# Patient Record
Sex: Female | Born: 1985
Health system: Southern US, Community
[De-identification: ages and names within clinical notes are randomized; demographics above are authoritative.]

## PROBLEM LIST (undated history)

## (undated) DIAGNOSIS — O2686 Pruritic urticarial papules and plaques of pregnancy (PUPPP): Secondary | ICD-10-CM

## (undated) DIAGNOSIS — O09299 Supervision of pregnancy with other poor reproductive or obstetric history, unspecified trimester: Secondary | ICD-10-CM

## (undated) DIAGNOSIS — E282 Polycystic ovarian syndrome: Secondary | ICD-10-CM

## (undated) DIAGNOSIS — K429 Umbilical hernia without obstruction or gangrene: Secondary | ICD-10-CM

## (undated) DIAGNOSIS — Z319 Encounter for procreative management, unspecified: Secondary | ICD-10-CM

## (undated) DIAGNOSIS — R7303 Prediabetes: Secondary | ICD-10-CM

## (undated) HISTORY — DX: Prediabetes: R73.03

## (undated) HISTORY — DX: Encounter for procreative management, unspecified: Z31.9

## (undated) HISTORY — DX: Polycystic ovarian syndrome: E28.2

## (undated) HISTORY — DX: Pruritic urticarial papules and plaques of pregnancy (puppp): O26.86

## (undated) HISTORY — PX: TONSILLECTOMY AND ADENOIDECTOMY: SUR1326

## (undated) HISTORY — DX: Supervision of pregnancy with other poor reproductive or obstetric history, unspecified trimester: O09.299

---
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
## 2007-04-01 HISTORY — PX: SHOULDER SURGERY: SHX246

## 2010-03-31 HISTORY — PX: COLPOSCOPY: SHX161

## 2011-04-01 HISTORY — PX: KNEE SURGERY: SHX244

## 2014-05-09 DIAGNOSIS — E282 Polycystic ovarian syndrome: Secondary | ICD-10-CM | POA: Insufficient documentation

## 2014-05-19 DIAGNOSIS — N926 Irregular menstruation, unspecified: Secondary | ICD-10-CM | POA: Insufficient documentation

## 2014-07-13 ENCOUNTER — Encounter: Payer: Self-pay | Admitting: Certified Nurse Midwife

## 2014-07-13 ENCOUNTER — Ambulatory Visit (INDEPENDENT_AMBULATORY_CARE_PROVIDER_SITE_OTHER): Payer: Medicaid Other | Admitting: Certified Nurse Midwife

## 2014-07-13 VITALS — BP 121/81 | HR 68 | Temp 97.8°F | Ht 67.0 in | Wt 201.0 lb

## 2014-07-13 DIAGNOSIS — L209 Atopic dermatitis, unspecified: Secondary | ICD-10-CM

## 2014-07-13 DIAGNOSIS — Z01419 Encounter for gynecological examination (general) (routine) without abnormal findings: Secondary | ICD-10-CM

## 2014-07-13 DIAGNOSIS — Z124 Encounter for screening for malignant neoplasm of cervix: Secondary | ICD-10-CM | POA: Diagnosis not present

## 2014-07-13 DIAGNOSIS — Z0000 Encounter for general adult medical examination without abnormal findings: Secondary | ICD-10-CM | POA: Diagnosis not present

## 2014-07-13 DIAGNOSIS — E282 Polycystic ovarian syndrome: Secondary | ICD-10-CM | POA: Diagnosis not present

## 2014-07-13 MED ORDER — PROGESTERONE MICRONIZED 200 MG PO CAPS: ORAL_CAPSULE | ORAL | Status: DC

## 2014-07-13 MED ORDER — TRIAMCINOLONE ACETONIDE 0.5 % EX OINT: 1.0000 "application " | TOPICAL_OINTMENT | Freq: Two times a day (BID) | CUTANEOUS | Status: DC

## 2014-07-13 MED ORDER — METFORMIN HCL 500 MG PO TABS: 500.0000 mg | ORAL_TABLET | Freq: Two times a day (BID) | ORAL | Status: DC

## 2014-07-13 NOTE — Progress Notes (Signed)
Patient ID: Kathy Williamson, female   DOB: [birth year], 29 y.o.   MRN: [id number]    Subjective:     Kathy Williamson is a 29 y.o. female here for a routine exam.  Current complaints: PCOS.  Prometrium helps with her sleep cycles.  Has been on Prometrium for 200 mg for 10 days for several years.  Does not desire to try OCPs.  Menstrual periods are regular, lasting 5-7 days.  Breast fed her daughter for 13 months.  Not currently sexually active.  Not currently employed.  Had postpartum depression, did not take medications or undergo counseling; mostly situational.   Has had L shoulder & R knee arthroscopy, has tendency for double jointed. Used to be a Counselling psychologist.          Personal health questionnaire:  Is patient Ashkenazi Jewish, have a family history of breast and/or ovarian cancer: no Is there a family history of uterine cancer diagnosed at age < 6, gastrointestinal cancer, urinary tract cancer, family member who is a Personnel officer syndrome-associated carrier: no Is the patient overweight and hypertensive, family history of diabetes, personal history of gestational diabetes, preeclampsia or PCOS: yes Is patient over 46, have PCOS,  family history of premature CHD under age 48, diabetes, smoke, have hypertension or peripheral artery disease:  no At any time, has a partner hit, kicked or otherwise hurt or frightened you?: no Over the past 2 weeks, have you felt down, depressed or hopeless?: no Over the past 2 weeks, have you felt little interest or pleasure in doing things?:sometimes   Gynecologic History Patient's last menstrual period was 07/06/2014. Contraception: abstinence Last Pap: 05/2013. Results were: normal, had hx of abnormal pap smears with colpo in 2012.   Last mammogram: N/A.   Obstetric History OB History  Gravida Para Term Preterm AB SAB TAB Ectopic Multiple Living  # Outcome Date GA Lbr Len/2nd Weight Sex Delivery Anes PTL Lv  1 Term 03/02/12 [redacted]w[redacted]d  3.742 kg (8  lb 4 oz) F Vag-Spont EPI N Y      Past Medical History  Diagnosis Date  . PCOS (polycystic ovarian syndrome)     Past Surgical History  Procedure Laterality Date  . Knee surgery Right 2013  . Shoulder surgery Left 2009  . Tonsillectomy and adenoidectomy    . Colposcopy N/A 2012     Current outpatient prescriptions:  .  metFORMIN (GLUCOPHAGE) 500 MG tablet, Take 1 tablet (500 mg total) by mouth 2 (two) times daily., Disp: 60 tablet, Rfl: 12 .  progesterone (PROMETRIUM) 200 MG capsule, Daily for 10 days out of the month in the evening, Disp: 10 capsule, Rfl: 12 .  triamcinolone ointment (KENALOG) 0.5 %, Apply 1 application topically 2 (two) times daily., Disp: 30 g, Rfl: 2 Allergies  Allergen Reactions  . Azithromycin Hives    History  Substance Use Topics  . Smoking status: Never Smoker   . Smokeless tobacco: Never Used  . Alcohol Use: No    Family History  Problem Relation Age of Onset  . Diabetes Mother   . Diabetes Maternal Grandmother   . Arthritis Maternal Grandmother   . Stroke Maternal Grandmother   . Kidney failure Maternal Grandmother   . Hypertension Maternal Grandmother   . Heart disease Maternal Grandfather   . Kidney failure Maternal Grandfather   . Cardiomyopathy Paternal Grandmother   . Cardiomyopathy Paternal Grandfather       Review  of Systems  Constitutional: negative for fatigue and weight loss, + weight gain about 15 lbs Respiratory: negative for cough and wheezing Cardiovascular: negative for chest pain, fatigue and palpitations Gastrointestinal: negative for abdominal pain and change in bowel habits Musculoskeletal:negative for myalgias Neurological: negative for gait problems and tremors Behavioral/Psych: negative for abusive relationship, depression Endocrine: negative for temperature intolerance   Genitourinary:negative for abnormal menstrual periods, genital lesions, hot flashes, sexual problems and vaginal discharge Integument/breast:  negative for breast lump, breast tenderness, nipple discharge and skin lesion(s)    Objective:       BP 121/81 mmHg  Pulse 68  Temp(Src) 97.8 F (36.6 C)  Ht 5\' 7"  (1.702 m)  Wt 91.173 kg (201 lb)  BMI 31.47 kg/m2  LMP 07/06/2014 General:   alert  Skin:   dermatitis present upper right arm, scars from surgeries on shoulder and knee.    Lungs:   clear to auscultation bilaterally  Heart:   regular rate and rhythm, S1, S2 normal, no murmur, click, rub or gallop  Breasts:   normal without suspicious masses, skin or nipple changes or axillary nodes  Abdomen:  normal findings: no organomegaly, soft, non-tender and no hernia  Pelvis:  External genitalia: normal general appearance Urinary system: urethral meatus normal and bladder without fullness, nontender Vaginal: normal without tenderness, induration or masses Cervix: normal appearance Adnexa: normal bimanual exam Uterus: anteverted and non-tender, normal size   Lab Review Urine pregnancy test Labs reviewed yes Radiologic studies reviewed no  50% of 30 min visit spent on counseling and coordination of care.   Assessment:    Healthy female exam.   PCOS Atopic Dermatitis   Plan:    Education reviewed: depression evaluation, low fat, low cholesterol diet, safe sex/STD prevention, self breast exams, skin cancer screening and weight bearing exercise. Contraception: abstinence. Follow up in: 1 year.   Meds ordered this encounter  Medications  . DISCONTD: metFORMIN (GLUCOPHAGE) 500 MG tablet    Sig: Take 500 mg by mouth.  . DISCONTD: progesterone (PROMETRIUM) 200 MG capsule    Sig: Take 200 mg by mouth.  . metFORMIN (GLUCOPHAGE) 500 MG tablet    Sig: Take 1 tablet (500 mg total) by mouth 2 (two) times daily.    Dispense:  60 tablet    Refill:  12  . progesterone (PROMETRIUM) 200 MG capsule    Sig: Daily for 10 days out of the month in the evening    Dispense:  10 capsule    Refill:  12  . triamcinolone ointment  (KENALOG) 0.5 %    Sig: Apply 1 application topically 2 (two) times daily.    Dispense:  30 g    Refill:  2   Orders Placed This Encounter  Procedures  . SureSwab, Vaginosis/Vaginitis Plus

## 2014-07-17 LAB — SURESWAB, VAGINOSIS/VAGINITIS PLUS
ATOPOBIUM VAGINAE: NOT DETECTED Log (cells/mL)
C. TRACHOMATIS RNA, TMA: NOT DETECTED
C. albicans, DNA: NOT DETECTED
C. glabrata, DNA: NOT DETECTED
C. parapsilosis, DNA: NOT DETECTED
C. tropicalis, DNA: NOT DETECTED
Gardnerella vaginalis: NOT DETECTED Log (cells/mL)
LACTOBACILLUS SPECIES: 7 Log (cells/mL)
MEGASPHAERA SPECIES: NOT DETECTED Log (cells/mL)
N. gonorrhoeae RNA, TMA: NOT DETECTED
T. VAGINALIS RNA, QL TMA: NOT DETECTED

## 2014-07-17 LAB — PAP IG AND HPV HIGH-RISK: HPV DNA High Risk: NOT DETECTED

## 2014-07-18 ENCOUNTER — Ambulatory Visit: Payer: Medicaid Other | Admitting: Certified Nurse Midwife

## 2015-04-23 ENCOUNTER — Other Ambulatory Visit: Payer: Self-pay | Admitting: Orthopedic Surgery

## 2015-04-23 DIAGNOSIS — M1712 Unilateral primary osteoarthritis, left knee: Secondary | ICD-10-CM

## 2015-04-23 DIAGNOSIS — M25562 Pain in left knee: Secondary | ICD-10-CM

## 2015-04-23 DIAGNOSIS — S83412A Sprain of medial collateral ligament of left knee, initial encounter: Secondary | ICD-10-CM

## 2015-05-11 ENCOUNTER — Ambulatory Visit: Payer: BLUE CROSS/BLUE SHIELD

## 2016-05-28 ENCOUNTER — Telehealth: Payer: Self-pay

## 2016-05-28 ENCOUNTER — Encounter: Payer: Self-pay | Admitting: Obstetrics and Gynecology

## 2016-05-28 DIAGNOSIS — Z6791 Unspecified blood type, Rh negative: Secondary | ICD-10-CM | POA: Insufficient documentation

## 2016-05-28 DIAGNOSIS — O26899 Other specified pregnancy related conditions, unspecified trimester: Secondary | ICD-10-CM

## 2016-05-28 NOTE — Telephone Encounter (Signed)
I am going to be off Thurs and Fri. Can you please look for these results and f/u with MD prn. Tonya, pls scan in [facility] and I can check there. Thx.

## 2016-05-28 NOTE — Telephone Encounter (Signed)
Pt had IUI done 05/15/16.  She called a few days ago for appt for blood preg test which she is schedule for tomorrow.  She has started bleeding.  Knows it could be implantation bleeding and it's hard to know if it's that or period. She is wondering if she should still do that test.  Please advise.

## 2016-05-28 NOTE — Telephone Encounter (Signed)
Pt states her blood type is negative as she got the rhogam shot c last preg.  Pt knows to still come in tomorrow for bloodwork.

## 2016-05-28 NOTE — Telephone Encounter (Signed)
(  Kathy Williamson) Pt is just to to lab with us. RN to notify pt to still have labs done. RN to also clarify pt's blood type in case miscarries.

## 2016-05-29 ENCOUNTER — Telehealth: Payer: Self-pay

## 2016-05-30 NOTE — Telephone Encounter (Signed)
Pt's quant from yesterday is <1.  What is next step for pt?  Does she need rhogam?

## 2016-05-30 NOTE — Telephone Encounter (Signed)
Pt's quant from yesterday is <1.  What is next step for this pt.  Does she need to come in for rhogam?

## 2016-05-30 NOTE — Telephone Encounter (Signed)
Let her know hCG is negative.  No need for Rhogam without a pos test. Thx.

## 2016-05-30 NOTE — Telephone Encounter (Signed)
Pt aware.

## 2016-06-10 ENCOUNTER — Other Ambulatory Visit: Payer: Self-pay | Admitting: Obstetrics and Gynecology

## 2016-06-10 MED ORDER — AMBULATORY NON FORMULARY MEDICATION: 2 refills | Status: DC

## 2016-06-11 ENCOUNTER — Telehealth: Payer: Self-pay

## 2016-06-11 NOTE — Telephone Encounter (Signed)
Pt states she has AE scheduled for 06/16/16. She realized she will be out of her topical progesterone for ~5 days prior to apt. Pt requesting enough to get her to apt. States [facility] faxed us a request & has not heard back. Cb#[id number].

## 2016-06-11 NOTE — Telephone Encounter (Signed)
I sent Rx in already. On meds list and was faxed yesterday by [facility] due to non formulary Rx. Pt to check with pharm to make sure they got it. Thx.

## 2016-06-16 ENCOUNTER — Encounter: Payer: Self-pay | Admitting: Obstetrics and Gynecology

## 2016-06-16 ENCOUNTER — Ambulatory Visit (INDEPENDENT_AMBULATORY_CARE_PROVIDER_SITE_OTHER): Payer: BLUE CROSS/BLUE SHIELD | Admitting: Obstetrics and Gynecology

## 2016-06-16 VITALS — BP 122/78 | Ht 67.0 in | Wt 201.0 lb

## 2016-06-16 DIAGNOSIS — N469 Male infertility, unspecified: Secondary | ICD-10-CM | POA: Insufficient documentation

## 2016-06-16 DIAGNOSIS — Z01419 Encounter for gynecological examination (general) (routine) without abnormal findings: Secondary | ICD-10-CM | POA: Diagnosis not present

## 2016-06-16 DIAGNOSIS — R7303 Prediabetes: Secondary | ICD-10-CM

## 2016-06-16 DIAGNOSIS — E282 Polycystic ovarian syndrome: Secondary | ICD-10-CM

## 2016-06-16 MED ORDER — AMBULATORY NON FORMULARY MEDICATION: 11 refills | Status: DC

## 2016-06-16 MED ORDER — METFORMIN HCL 500 MG PO TABS: 500.0000 mg | ORAL_TABLET | Freq: Every day | ORAL | 12 refills | Status: DC

## 2016-06-16 NOTE — Progress Notes (Signed)
HPI:      [name] is a 31 y.o. G1P1001 who LMP was Patient's last menstrual period was 05/27/2016., presents today for her annual examination.  Her menses are regular every 28-30 days, lasting 5 days.  Dysmenorrhea some, treats with NSAIDs. She does not have intermenstrual bleeding.  Sex activity: single partner. She is trying to conceive and had unsuccessful IUI 2/18. She is using prog crm and metformin 500 mg daily due to hx of PCOS. She conceived in past with these meds. She is ovulating and had another IUI 06/14/16. She would like beta HcG done when indicated.   12/17 NOTE: She has a hx of PCOS and is on prog crm 4%, 1 ml daily with sx relief of insomnia, PMS sx, and fatigue. She is still getting monthly menses, lasting 5-7 days with med flow. She denies BTB. She conceived with her daughter while on prometrium 200 mg daily. Her husband recently lost a lot of wt and is having issues with extra skin covering his penis and affecting function. His urologist suggested IUI for the couple since they would like to conceive soon and he needs further wt loss before treatment.  She is still taking metformin 500 mg QHS for pre-DM.  Last Pap: May 23, 2015  Results were: no abnormalities /neg HPV DNA  Hx of STDs: none  There is a FH of breast cancer in her Kathy Williamson, genetic testing not indicated. There is no FH of ovarian cancer. The patient does do self-breast exams.  Tobacco use: The patient denies current or previous tobacco use. Alcohol use: none Exercise: moderately active  She does get adequate calcium and Vitamin D in her diet.   Past Medical History:  Diagnosis Date  . Infertility management   . PCOS (polycystic ovarian syndrome)   . Pre-diabetes     Past Surgical History:  Procedure Laterality Date  . COLPOSCOPY N/A 2012  . KNEE SURGERY Right 2013  . SHOULDER SURGERY Left 2009  . TONSILLECTOMY AND ADENOIDECTOMY      Family History  Problem Relation Age of Onset    . Diabetes Mother   . Diabetes Maternal Grandmother   . Arthritis Maternal Grandmother   . Stroke Maternal Grandmother   . Kidney failure Maternal Grandmother   . Hypertension Maternal Grandmother   . Heart disease Maternal Grandfather   . Kidney failure Maternal Grandfather   . Cardiomyopathy Paternal Grandmother   . Cardiomyopathy Paternal Grandfather   . Breast cancer Other      ROS:  Review of Systems  Constitutional: Negative for fever, malaise/fatigue and weight loss.  HENT: Negative for congestion, ear pain and sinus pain.   Respiratory: Negative for cough, shortness of breath and wheezing.   Cardiovascular: Negative for chest pain, orthopnea and leg swelling.  Gastrointestinal: Negative for constipation, diarrhea, nausea and vomiting.  Genitourinary: Negative for dysuria, frequency, hematuria and urgency.       Breast ROS: negative   Musculoskeletal: Negative for back pain, joint pain and myalgias.  Skin: Negative for itching and rash.  Neurological: Negative for dizziness, tingling, focal weakness and headaches.  Endo/Heme/Allergies: Negative for environmental allergies. Does not bruise/bleed easily.  Psychiatric/Behavioral: Negative for depression and suicidal ideas. The patient is not nervous/anxious and does not have insomnia.     Objective: BP 122/78   Ht 5\' 7"  (1.702 m)   Wt 201 lb (91.2 kg)   LMP 05/27/2016   BMI 31.48 kg/m    Physical Exam  Constitutional: She is oriented to person, place, and time. She appears well-developed and well-nourished.  Genitourinary: Vagina normal and uterus normal. No erythema or tenderness in the vagina. No vaginal discharge found. Right adnexum does not display mass and does not display tenderness. Left adnexum does not display mass and does not display tenderness. Cervix does not exhibit motion tenderness or polyp. Uterus is not enlarged or tender.  Neck: Normal range of motion. No thyromegaly present.  Cardiovascular:  Normal rate, regular rhythm and normal heart sounds.   No murmur heard. Pulmonary/Chest: Effort normal and breath sounds normal. Right breast exhibits no mass, no nipple discharge, no skin change and no tenderness. Left breast exhibits no mass, no nipple discharge, no skin change and no tenderness.  Abdominal: Soft. There is no tenderness. There is no guarding.  Musculoskeletal: Normal range of motion.  Neurological: She is alert and oriented to person, place, and time. No cranial nerve deficit.  Psychiatric: She has a normal mood and affect. Her behavior is normal.  Vitals reviewed.   Assessment/Plan: Encounter for annual routine gynecological examination  PCOS (polycystic ovarian syndrome) - Cont progesterone crm (sent to [facility]) and metformin (sent to [facility]). Will adjust prn pregnancy. - Plan: metFORMIN (GLUCOPHAGE) 500 MG tablet, AMBULATORY NON FORMULARY MEDICATION  Pre-diabetes - Check labs. Cont metformin. Doing diet/exercise changes.  - Plan: Comprehensive metabolic panel, Hemoglobin A1c  Infertility female - [place] in [place]. Due for beta HcG 06/30/16. Will f/u with results. - Plan: Beta HCG, Quant    Meds ordered this encounter  Medications  . metFORMIN (GLUCOPHAGE) 500 MG tablet    Sig: Take 1 tablet (500 mg total) by mouth daily.    Dispense:  30 tablet    Refill:  12  . AMBULATORY NON FORMULARY MEDICATION    Sig: Progesterone 4% HRT cream Apply 1 ml topically to inner arm/thigh as directed    Dispense:  30 mL    Refill:  11               GYN counsel adequate intake of calcium and vitamin D, diet and exercise     F/U  Return in about 1 year (around 06/16/2017), or if symptoms worsen or fail to improve.  Alicia B. Copland, PA-C 06/16/2016 12:04 PM

## 2016-06-17 LAB — COMPREHENSIVE METABOLIC PANEL
A/G RATIO: 1.7 (ref 1.2–2.2)
ALK PHOS: 73 IU/L (ref 39–117)
ALT: 12 IU/L (ref 0–32)
AST: 17 IU/L (ref 0–40)
Albumin: 4.5 g/dL (ref 3.5–5.5)
BILIRUBIN TOTAL: 0.2 mg/dL (ref 0.0–1.2)
BUN/Creatinine Ratio: 25 — ABNORMAL HIGH (ref 9–23)
BUN: 14 mg/dL (ref 6–20)
CO2: 23 mmol/L (ref 18–29)
Calcium: 9.1 mg/dL (ref 8.7–10.2)
Chloride: 98 mmol/L (ref 96–106)
Creatinine, Ser: 0.57 mg/dL (ref 0.57–1.00)
GFR calc Af Amer: 141 mL/min/{1.73_m2} (ref >59)
GFR calc non Af Amer: 122 mL/min/{1.73_m2} (ref >59)
Globulin, Total: 2.6 g/dL (ref 1.5–4.5)
Glucose: 85 mg/dL (ref 65–99)
POTASSIUM: 4.1 mmol/L (ref 3.5–5.2)
SODIUM: 138 mmol/L (ref 134–144)
Total Protein: 7.1 g/dL (ref 6.0–8.5)

## 2016-06-17 LAB — HEMOGLOBIN A1C
Est. average glucose Bld gHb Est-mCnc: 111 mg/dL
HEMOGLOBIN A1C: 5.5 % (ref 4.8–5.6)

## 2016-06-24 ENCOUNTER — Telehealth: Payer: Self-pay

## 2016-06-24 NOTE — Telephone Encounter (Signed)
Pt calling - wants to talk to [facility].  Fertility clinic thinks the transdermal progesterone may be interferring c her getting preg which you have discussed.  Pt has started her period today.  Does she need to stop the transdermal progesterone for a while completely until IUI?  What would help?  [phone]

## 2016-06-24 NOTE — Telephone Encounter (Signed)
Spoke with pt. She needs to defer medication mgmt to REI since doing IUI. She does daily progesterone instead of luteal phase. She conceived with prometrium 100 mg daily last pregnancy and not cream. She wonders if there is a difference in strength. REI should be able to manage this. Pt understands. Last IUI 06/14/16.

## 2016-07-28 ENCOUNTER — Other Ambulatory Visit: Payer: Self-pay | Admitting: Obstetrics and Gynecology

## 2016-07-28 MED ORDER — PROGESTERONE MICRONIZED 200 MG PO CAPS: ORAL_CAPSULE | ORAL | 3 refills | Status: DC

## 2016-07-28 NOTE — Telephone Encounter (Signed)
Spoke with pt. She has tried IUI without success. REI not giving much help in terms of prog tx. Pt is not going to continue IUI for 4 or more months right now. She wants to restart daily prog, which is what makes her feel better given her extremely low prog levels. She conceived on prometrium 200 mg in the past. Her husband has a normal semen analysis. Pt wants to try prometrium instead of transderm prog until she wants to do IUI again. Will do days 1-25. Rx eRxd.

## 2016-07-28 NOTE — Telephone Encounter (Signed)
Pt wanting to speak w/[facility] regarding how to proceed w/dosing of her transdermal progesterone that she takes. She has been attempting pregnancy w/IUI. Last months attempt was unsuccesful. Pt requesting a quick phone consult or see if she needs another apt to discuss further. [phone].

## 2016-09-22 ENCOUNTER — Other Ambulatory Visit: Payer: Self-pay | Admitting: Obstetrics and Gynecology

## 2016-09-29 ENCOUNTER — Telehealth: Payer: Self-pay

## 2016-09-29 NOTE — Telephone Encounter (Signed)
Pt has had two confirmed preg tests.  Has appt c [facility] on the 17th.  Needs adv regarding progesterone therapy.  She's been taking oral prometrium 200mg  for 1st 25d of cyles as she was trying to get preg.  [facility] told her it was important to get back on it once a preg confirmed.  Please call c adv.  [phone]

## 2016-09-29 NOTE — Telephone Encounter (Signed)
Please advise 

## 2016-09-30 NOTE — Telephone Encounter (Signed)
Not knowing what her pregnancy history is, I would say that it is ok for her to continue taking the progesterone therapy until she sees me and we can discuss it further at that time.

## 2016-10-14 ENCOUNTER — Ambulatory Visit (INDEPENDENT_AMBULATORY_CARE_PROVIDER_SITE_OTHER): Payer: BLUE CROSS/BLUE SHIELD | Admitting: Obstetrics and Gynecology

## 2016-10-14 ENCOUNTER — Encounter: Payer: Self-pay | Admitting: Obstetrics and Gynecology

## 2016-10-14 ENCOUNTER — Telehealth: Payer: Self-pay

## 2016-10-14 VITALS — BP 124/70 | Wt 197.0 lb

## 2016-10-14 DIAGNOSIS — E282 Polycystic ovarian syndrome: Secondary | ICD-10-CM

## 2016-10-14 DIAGNOSIS — O99211 Obesity complicating pregnancy, first trimester: Secondary | ICD-10-CM

## 2016-10-14 DIAGNOSIS — Z6830 Body mass index (BMI) 30.0-30.9, adult: Secondary | ICD-10-CM | POA: Insufficient documentation

## 2016-10-14 DIAGNOSIS — O0990 Supervision of high risk pregnancy, unspecified, unspecified trimester: Secondary | ICD-10-CM | POA: Insufficient documentation

## 2016-10-14 DIAGNOSIS — Z6791 Unspecified blood type, Rh negative: Secondary | ICD-10-CM

## 2016-10-14 DIAGNOSIS — Z3A01 Less than 8 weeks gestation of pregnancy: Secondary | ICD-10-CM

## 2016-10-14 DIAGNOSIS — O26899 Other specified pregnancy related conditions, unspecified trimester: Secondary | ICD-10-CM

## 2016-10-14 DIAGNOSIS — O09899 Supervision of other high risk pregnancies, unspecified trimester: Secondary | ICD-10-CM

## 2016-10-14 DIAGNOSIS — O99210 Obesity complicating pregnancy, unspecified trimester: Secondary | ICD-10-CM | POA: Insufficient documentation

## 2016-10-14 MED ORDER — PROGESTERONE 8 % VA GEL: 90.0000 mg | Freq: Every day | VAGINAL | 1 refills | Status: AC

## 2016-10-14 NOTE — Telephone Encounter (Signed)
Left detailed msg.

## 2016-10-14 NOTE — Telephone Encounter (Signed)
Pt is 5wks preb and has appt today c [facility].  [facility] adv re progesterone tx.  Stopped it when she was trying to conceive then started back when she found out she was preg.  It helps her steep tremendously.  Is wondering if ABC will see her separately about managing this or just compare note as preg progresses.  Please call.  [phone]

## 2016-10-14 NOTE — Progress Notes (Signed)
New Obstetric Patient H&P   Chief Complaint: "Desires prenatal care"   History of Present Illness: Patient is a 31 y.o. 522P1001 Declined female, LMP 08/24/16 presents with amenorrhea and positive home pregnancy test. Based on her  LMP, her EDD is 05/31/17. and her EGA is 7568w2d. Cycles are 6. days, regular, and occur approximately every : 28 days. Her last pap smear was 2 years ago and was no abnormalities.    She had a urine pregnancy test which was positive 2 week(s)  ago. Her last menstrual period was normal and lasted for  5 day(s). Since her LMP she claims she has experienced no issues. She denies vaginal bleeding. Her past medical history is contibutory for PCOS. Her prior pregnancies are notable for no complications.  This pregnancy was obtained by a "home version" of IUI during her ovulatory phase of her last menstrual cycle.  Since her LMP, she admits to the use of tobacco products  no She claims she has gained  zero pounds since the start of her pregnancy.  There are cats in the home in the home  no  She admits close contact with children on a regular basis  yes  She has had chicken pox in the past yes She has had Tuberculosis exposures, symptoms, or previously tested positive for TB   no Current or past history of domestic violence. no  Genetic Screening/Teratology Counseling: (Includes patient, baby's father, or anyone in either family with:)   1. [facility]= [facility]  no 2. Thalassemia ([place], [place], Mediterranean, or Asian background): MCV<80  no 3. Neural tube defect (meningomyelocele, spina bifida, anencephaly)  no 4. Congenital heart defect  no  5. Down syndrome  no 6. Tay-Sachs (Jewish, [place])  no 7. Canavan's Disease  no 8. Sickle cell disease or trait (African)  no  9. Hemophilia or other blood disorders  no  10. Muscular dystrophy  no  11. Cystic fibrosis  no  12. Huntington's Chorea  no  13. Mental retardation/autism  no 14. Other inherited  genetic or chromosomal disorder  no 15. Maternal metabolic disorder (DM, PKU, etc)  no 16. Patient or FOB with a child with a birth defect not listed above no  16a. Patient or FOB with a birth defect themselves no 17. Recurrent pregnancy loss, or stillbirth  no  18. Any medications since LMP other than prenatal vitamins (include vitamins, supplements, OTC meds, drugs, alcohol)  no 19. Any other genetic/environmental exposure to discuss  no  Infection History:   1. Lives with someone with TB or TB exposed  no  2. Patient or partner has history of genital herpes  no 3. Rash or viral illness since LMP  no 4. History of STI (GC, CT, HPV, syphilis, HIV)  no 5. History of recent travel :  no  Other pertinent information:  Yes. Patient has been taking metformin for a history of PCOS. She did not have GDM with prior pregnancy. However, she states that she was much thinner and in much better health with that pregnancy.  She states that she has never taken a glucose tolerance test outside of a pregnancy setting.  Review of Systems  Constitutional: Negative.   HENT: Negative.   Eyes: Negative.   Respiratory: Negative.   Cardiovascular: Negative.   Gastrointestinal: Negative.   Genitourinary: Negative.   Musculoskeletal: Negative.   Skin: Negative.   Neurological: Negative.   Psychiatric/Behavioral: Negative.      Past Medical History:  Diagnosis  Date  . Infertility management   . PCOS (polycystic ovarian syndrome)   . Pre-diabetes     Past Surgical History:  Procedure Laterality Date  . COLPOSCOPY N/A 2012  . KNEE SURGERY Right 2013  . SHOULDER SURGERY Left 2009  . TONSILLECTOMY AND ADENOIDECTOMY      Gynecologic History: Patient's last menstrual period was 08/24/2016.  Obstetric History: G2P1001  Family History  Problem Relation Age of Onset  . Diabetes Mother   . Diabetes Maternal Grandmother   . Arthritis Maternal Grandmother   . Stroke Maternal Grandmother   . Kidney  failure Maternal Grandmother   . Hypertension Maternal Grandmother   . Heart disease Maternal Grandfather   . Kidney failure Maternal Grandfather   . Cardiomyopathy Paternal Grandmother   . Cardiomyopathy Paternal Grandfather   . Breast cancer Other     Social History   Social History  . Marital status: Single    Spouse name: N/A  . Number of children: N/A  . Years of education: N/A   Occupational History  . Not on file.   Social History Main Topics  . Smoking status: Never Smoker  . Smokeless tobacco: Never Used  . Alcohol use No  . Drug use: No  . Sexual activity: Yes    Birth control/ protection: None   Other Topics Concern  . Not on file   Social History Narrative  . No narrative on file    Allergies  Allergen Reactions  . Azithromycin Hives    Prior to Admission medications   Medication Sig Start Date End Date Taking? Authorizing Provider  metFORMIN (GLUCOPHAGE) 500 MG tablet Take 1 tablet (500 mg total) by mouth daily. 06/16/16  Yes Copland, Ilona Sorrel, PA-C  progesterone (PROMETRIUM) 200 MG capsule Take 1 tablet nightly days 1-25 days of cycle 07/28/16  Yes Copland, Alicia B, PA-C  metFORMIN (GLUCOPHAGE-XR) 500 MG 24 hr tablet TAKE 1 TABLET BY MOUTH DAILY WITH EVENING MEAL Patient not taking: Reported on 10/14/2016 09/22/16   Copland, Chucky May    Physical Exam BP 124/70   Wt 197 lb (89.4 kg)   LMP 08/24/2016   BMI 30.85 kg/m   Physical Exam  Constitutional: She is oriented to person, place, and time. She appears well-developed and well-nourished. No distress.  HENT:  Head: Normocephalic and atraumatic.  Eyes: Conjunctivae are normal.  Neck: Normal range of motion. Neck supple. No thyromegaly present.  Cardiovascular: Normal rate, regular rhythm and normal heart sounds.  Exam reveals no gallop and no friction rub.   No murmur heard. Pulmonary/Chest: Effort normal and breath sounds normal. She has no wheezes.  Abdominal: Soft. She exhibits no  distension and no mass. There is no tenderness. There is no rebound and no guarding. No hernia. Hernia confirmed negative in the right inguinal area and confirmed negative in the left inguinal area.  Genitourinary: Vagina normal. Pelvic exam was performed with patient supine. There is no rash, tenderness or lesion on the right labia. There is no rash, tenderness or lesion on the left labia. Uterus is enlarged. Uterus is not tender. Cervix exhibits no motion tenderness, no discharge and no friability. Right adnexum displays no mass, no tenderness and no fullness. Left adnexum displays no mass, no tenderness and no fullness. No erythema, tenderness or bleeding in the vagina. No signs of injury around the vagina. No vaginal discharge found.  Musculoskeletal: Normal range of motion.  Lymphadenopathy:    She has no cervical adenopathy.  Right: No inguinal adenopathy present.       Left: No inguinal adenopathy present.  Neurological: She is alert and oriented to person, place, and time.  Skin: Skin is warm and dry. No rash noted.  Psychiatric: She has a normal mood and affect. Her behavior is normal. Judgment normal.     Female Chaperone present during breast and/or pelvic exam.  Assessment: 31 y.o. G2P1001 at Unknown presenting to initiate prenatal care  Plan: 1) Avoid alcoholic beverages. 2) Patient encouraged not to smoke.  3) Discontinue the use of all non-medicinal drugs and chemicals.  4) Take prenatal vitamins daily.  5) Nutrition, food safety (fish, cheese advisories, and high nitrite foods) and exercise discussed. 6) Hospital and practice style discussed with cross coverage system.  7) Genetic Screening, such as with 1st Trimester Screening, cell free fetal DNA, AFP testing, and Ultrasound, as well as with amniocentesis and CVS as appropriate, is discussed with patient. At the conclusion of today's visit patient declined genetic testing 8) Patient is asked about travel to areas at  risk for the [place] virus, and counseled to avoid travel and exposure to mosquitoes or sexual partners who may have themselves been exposed to the virus. Testing is discussed, and will be ordered as appropriate.  9) progesterone treatment: discussed that there is no evidence to support the use of progesterone in pregnancy. However, patient is sure she needs this medication regardless of pregnancy status. She understands we will stop the medication after 10-11 weeks or at least wean the medication over several weeks.  Discussed that if she is taking progesterone, that oral progesterone (prometrium) does not absorb well and that a vaginal route is preferred. Will rx a topic, vaginal version.   [name], MD 10/14/2016 2:25 PM

## 2016-10-14 NOTE — Telephone Encounter (Signed)
RN to notify pt that OB providers will manage progesterone during pregnancy for her. CONGRATULATIONS!!!!!

## 2016-10-15 ENCOUNTER — Telehealth: Payer: Self-pay

## 2016-10-15 NOTE — Telephone Encounter (Signed)
Pt was seen yesterday by SDJ .  During visit, progesterone was discussed.  SDJ felt it would be best to do vaginal progesterone.  RX given.  It is not covered by ins.  It is $1000/rx.  Should she continue to take the oral prometrium or do you have any other suggestions?  What to do?  [phone]

## 2016-10-15 NOTE — Telephone Encounter (Signed)
She can try one of several things because the idea is to get her extra progesterone. 1) she can continue taking the capsules she has and make no changes. I chose to recommend changing this because only about 10% of the progesterone is taken up when she takes it orally. 2) she can use the capsule, but place it vaginally.  This is an off-label use of the medicine. However, many obstetricians use it this way because it is better absorbed and delivers the progesterone directly to the uterus (and is safe).  With this there would no cost difference to her. She would simply use it once per day in a different way. 3) Use the progesterone that I prescribed. There is a coupon on [employer] that makes the price lower ($500-600 total).  But, still very expensive. 4) there is a vaginal insert I can prescribe. However, it is also very expensive, if not covered by insurance. On [employer] a coupon can be used to get the price in the $200-300 range or so. Still very expensive.  Because there is no good data about the use of progesterone at all in this time in the pregnancy, what she chooses doesn't really matter.  My top recommendation would be to use the pill she is taking and place it vaginally instead once per day. This is a no cost-change option for her and she will get better uptake of the progesterone.  It is hard to know how much more or less progesterone she would be exposed to in options 2-4.  So, really she could choose any of these options.    I hope this will help her.  Please let me know, if she has questions.

## 2016-10-16 LAB — RPR+RH+ABO+RUB AB+AB SCR+CB...
ANTIBODY SCREEN: NEGATIVE
HEMOGLOBIN: 12.8 g/dL (ref 11.1–15.9)
HIV Screen 4th Generation wRfx: NONREACTIVE
Hematocrit: 38.7 % (ref 34.0–46.6)
Hepatitis B Surface Ag: NEGATIVE
MCH: 28.5 pg (ref 26.6–33.0)
MCHC: 33.1 g/dL (ref 31.5–35.7)
MCV: 86 fL (ref 79–97)
Platelets: 311 10*3/uL (ref 150–379)
RBC: 4.49 x10E6/uL (ref 3.77–5.28)
RDW: 13 % (ref 12.3–15.4)
RH TYPE: NEGATIVE
RPR Ser Ql: NONREACTIVE
Rubella Antibodies, IGG: 2.71 index (ref >0.99)
VARICELLA: 2325 {index} (ref >165)
WBC: 8.9 10*3/uL (ref 3.4–10.8)

## 2016-10-16 LAB — URINE CULTURE

## 2016-10-16 LAB — GC/CHLAMYDIA PROBE AMP
CHLAMYDIA, DNA PROBE: NEGATIVE
Neisseria gonorrhoeae by PCR: NEGATIVE

## 2016-10-22 ENCOUNTER — Encounter: Payer: BLUE CROSS/BLUE SHIELD | Admitting: Advanced Practice Midwife

## 2016-10-22 ENCOUNTER — Ambulatory Visit: Payer: BLUE CROSS/BLUE SHIELD

## 2016-10-22 ENCOUNTER — Other Ambulatory Visit: Payer: BLUE CROSS/BLUE SHIELD

## 2016-10-22 NOTE — Telephone Encounter (Signed)
I left a msg for pt to return my call at the end of last week. Please let me know if she calls back and transfer to me

## 2016-10-23 LAB — GLUCOSE, 1 HOUR GESTATIONAL: GESTATIONAL DIABETES SCREEN: 120 mg/dL (ref 65–139)

## 2016-10-24 ENCOUNTER — Ambulatory Visit: Payer: BLUE CROSS/BLUE SHIELD

## 2016-10-24 ENCOUNTER — Ambulatory Visit (INDEPENDENT_AMBULATORY_CARE_PROVIDER_SITE_OTHER): Payer: BLUE CROSS/BLUE SHIELD | Admitting: Advanced Practice Midwife

## 2016-10-24 VITALS — BP 122/70 | Wt 200.0 lb

## 2016-10-24 DIAGNOSIS — Z3A08 8 weeks gestation of pregnancy: Secondary | ICD-10-CM

## 2016-10-24 NOTE — Telephone Encounter (Signed)
attemping to call pt again with no answer.  Please let me or Kathy SquibbJane know when she calls back Kathy Williamson([name] saw pt today) need tomake sure we have correct phone number as well since this is second time calling.

## 2016-10-24 NOTE — Progress Notes (Signed)
Dating u/s today. No vb. No lof.  

## 2016-10-24 NOTE — Progress Notes (Signed)
Dating scan today- dates are equal to LMP within 5 days. EDD not adjusted per ACOG guidelines. Patient has question regarding medications- Crinone is very expensive and she did not fill Rx. She left a message and states she did not hear back from us. Further review of chart indicates there [facility] options (per Dr Jean RosenthalJackson). She had not returned the call. Meriam SpragueBeverly attempted another call this morning following our visit and left another message. Options are laid out clearly in telephone notes. 1. Continue oral prometrium, 2. Use prometrium vaginally, 3. Coupon available through [employer] for discount on Crinone, 4. Alternate suppository available through [employer] [facility] for less than Crinone. I will attempt again to reach patient today. Return to clinic in 4 weeks for rob. Declines genetic screening.

## 2016-11-15 ENCOUNTER — Other Ambulatory Visit: Payer: Self-pay | Admitting: Obstetrics and Gynecology

## 2016-11-20 ENCOUNTER — Encounter: Payer: BLUE CROSS/BLUE SHIELD | Admitting: Obstetrics & Gynecology

## 2016-11-24 ENCOUNTER — Ambulatory Visit (INDEPENDENT_AMBULATORY_CARE_PROVIDER_SITE_OTHER): Payer: BLUE CROSS/BLUE SHIELD | Admitting: Obstetrics and Gynecology

## 2016-11-24 VITALS — BP 122/70 | Wt 198.0 lb

## 2016-11-24 DIAGNOSIS — Z3A13 13 weeks gestation of pregnancy: Secondary | ICD-10-CM

## 2016-11-24 DIAGNOSIS — O26899 Other specified pregnancy related conditions, unspecified trimester: Secondary | ICD-10-CM

## 2016-11-24 DIAGNOSIS — O99211 Obesity complicating pregnancy, first trimester: Secondary | ICD-10-CM

## 2016-11-24 DIAGNOSIS — Z6830 Body mass index (BMI) 30.0-30.9, adult: Secondary | ICD-10-CM

## 2016-11-24 DIAGNOSIS — O09899 Supervision of other high risk pregnancies, unspecified trimester: Secondary | ICD-10-CM

## 2016-11-24 DIAGNOSIS — Z6791 Unspecified blood type, Rh negative: Secondary | ICD-10-CM

## 2016-11-24 DIAGNOSIS — O0990 Supervision of high risk pregnancy, unspecified, unspecified trimester: Secondary | ICD-10-CM

## 2016-11-24 NOTE — Progress Notes (Signed)
  Routine Prenatal Care Visit  Subjective  Kathy Williamson is a 31 y.o. G2P1001 at [redacted]w[redacted]d being seen today for ongoing prenatal care.  She is currently monitored for the following issues for this high-risk pregnancy and has Irregular bleeding; Bilateral polycystic ovarian syndrome; Rh negative state in antepartum period; Infertility female; Supervision of high risk pregnancy, antepartum; Obesity affecting pregnancy; and BMI 30.0-30.9,adult on her problem list.  ----------------------------------------------------------------------------------- Patient reports no complaints.  Mild nausea.  Has stopped taking vaginal progesterone. Contractions: Not present. Vag. Bleeding: None.   . Denies leaking of fluid.  ----------------------------------------------------------------------------------- The following portions of the patient's history were reviewed and updated as appropriate: allergies, current medications, past family history, past medical history, past social history, past surgical history and problem list. Problem list updated.   Objective  Blood pressure 122/70, weight 198 lb (89.8 kg), last menstrual period 08/24/2016. Pregravid weight 197 lb (89.4 kg) Total Weight Gain 1 lb (0.454 kg) Urinalysis: Urine Protein: Negative Urine Glucose: Negative  Fetal Status: Fetal Heart Rate (bpm): 160         General:  Alert, oriented and cooperative. Patient is in no acute distress.  Skin: Skin is warm and dry. No rash noted.   Cardiovascular: Normal heart rate noted  Respiratory: Normal respiratory effort, no problems with respiration noted  Abdomen: Soft, gravid, appropriate for gestational age. Pain/Pressure: Absent     Pelvic:  Cervical exam deferred        Extremities: Normal range of motion.     Mental Status: Normal mood and affect. Normal behavior. Normal judgment and thought content.     Assessment   31 y.o. G2P1001 at [redacted]w[redacted]d by  05/31/2017, by Last Menstrual Period presenting for routine  prenatal visit  Plan   pregnancy Problems (from 10/14/16 to present)    Problem Noted Resolved   Supervision of high risk pregnancy, antepartum 10/14/2016 by Conard Novak, MD No   Overview Addendum 11/24/2016 11:54 AM by Conard Novak, MD    [facility] Prenatal Labs  Dating Sure LMP, u/s pending Blood type: A/Negative/-- (07/17 1510)   Genetic Screen 1 Screen: declines  AFP:     Quad:     NIPS: Antibody:Negative (07/17 1510)  Anatomic [place]  Rubella: 2.71 (07/17 1510) Varicella:    GTT Early: 120  Third trimester:  RPR: Non Reactive (07/17 1510)   Rhogam [ ]  due 28 weeks HBsAg: Negative (07/17 1510)   TDaP vaccine                       Flu Shot: HIV:     Baby Food                                GBS:   Contraception  Pap:  CBB     CS/VBAC    Support Person              Obesity affecting pregnancy 10/14/2016 by Conard Novak, MD No   Overview Addendum 10/23/2016 10:26 AM by Conard Novak, MD    [x]  early 1 hour gtt - 120 mg/dL      BMI 55.9-74.1,ULAGT 10/14/2016 by Conard Novak, MD No     Please refer to After Visit Summary for other counseling recommendations.   Return in about 4 weeks (around 12/22/2016) for Routine Prenatal Appointment.  Kathy Mohair, MD  11/24/2016 12:18 PM

## 2016-12-22 ENCOUNTER — Ambulatory Visit (INDEPENDENT_AMBULATORY_CARE_PROVIDER_SITE_OTHER): Payer: BLUE CROSS/BLUE SHIELD | Admitting: Maternal Newborn

## 2016-12-22 VITALS — BP 108/68 | Wt 193.0 lb

## 2016-12-22 DIAGNOSIS — Z3A17 17 weeks gestation of pregnancy: Secondary | ICD-10-CM

## 2016-12-22 DIAGNOSIS — O09899 Supervision of other high risk pregnancies, unspecified trimester: Secondary | ICD-10-CM

## 2016-12-22 DIAGNOSIS — O99212 Obesity complicating pregnancy, second trimester: Secondary | ICD-10-CM

## 2016-12-22 DIAGNOSIS — Z6830 Body mass index (BMI) 30.0-30.9, adult: Secondary | ICD-10-CM

## 2016-12-22 DIAGNOSIS — Z6791 Unspecified blood type, Rh negative: Secondary | ICD-10-CM

## 2016-12-22 DIAGNOSIS — O26899 Other specified pregnancy related conditions, unspecified trimester: Secondary | ICD-10-CM

## 2016-12-22 DIAGNOSIS — O0990 Supervision of high risk pregnancy, unspecified, unspecified trimester: Secondary | ICD-10-CM

## 2016-12-22 NOTE — Progress Notes (Signed)
Routine Prenatal Care Visit  Subjective  Kathy Williamson is a 31 y.o. G2P1001 at [redacted]w[redacted]d being seen today for ongoing prenatal care.  She is currently monitored for the following issues for this high-risk pregnancy and has Irregular bleeding; Bilateral polycystic ovarian syndrome; Rh negative state in antepartum period; Infertility female; Supervision of high risk pregnancy, antepartum; Obesity affecting pregnancy; and BMI 30.0-30.9,adult on her problem list.  ----------------------------------------------------------------------------------- Patient reports cold/allergies for the past week, but starting to improve.   Contractions: Not present. Vag. Bleeding: None.  Denies leaking of fluid.  ----------------------------------------------------------------------------------- The following portions of the patient's history were reviewed and updated as appropriate: allergies, current medications, past family history, past medical history, past social history, past surgical history and problem list. Problem list updated.   Objective  Blood pressure 108/68, weight 193 lb (87.5 kg), last menstrual period 08/24/2016. Pregravid weight 197 lb (89.4 kg) Total Weight Gain -4 lb (-1.814 kg) Urinalysis: Urine Protein: Negative Urine Glucose: Negative  Fetal Status: Fetal Heart Rate (bpm): 142         General:  Alert, oriented and cooperative. Patient is in no acute distress.  Skin: Skin is warm and dry. No rash noted.   Cardiovascular: Normal heart rate noted  Respiratory: Normal respiratory effort, no problems with respiration noted  Abdomen: Soft, gravid, appropriate for gestational age. Pain/Pressure: Absent     Pelvic:  Cervical exam deferred        Extremities: Normal range of motion.  Edema: None  Mental Status: Normal mood and affect. Normal behavior. Normal judgment and thought content.     Assessment   31 y.o. G2P1001 at [redacted]w[redacted]d by  05/31/2017, by Last Menstrual Period presenting for routine  prenatal visit.  Plan   pregnancy Problems (from 10/14/16 to present)    Problem Noted Resolved   Supervision of high risk pregnancy, antepartum 10/14/2016 by Conard Novak, MD No   Overview Addendum 11/24/2016 11:54 AM by Conard Novak, MD    [facility] Prenatal Labs  Dating Sure LMP, u/s pending Blood type: A/Negative/-- (07/17 1510)   Genetic Screen 1 Screen: declines  AFP:     Quad:     NIPS: Antibody:Negative (07/17 1510)  Anatomic [place]  Rubella: 2.71 (07/17 1510) Varicella:    GTT Early: 120  Third trimester:  RPR: Non Reactive (07/17 1510)   Rhogam  due 28 weeks HBsAg: Negative (07/17 1510)   TDaP vaccine                       Flu Shot: HIV:     Baby Food                                GBS:   Contraception  Pap:  CBB     CS/VBAC    Support Person               Obesity affecting pregnancy 10/14/2016 by Conard Novak, MD No   Overview Addendum 10/23/2016 10:26 AM by Conard Novak, MD     early 1 hour gtt - 120 mg/dL      BMI 36.6-44.0,HKVQQ 10/14/2016 by Conard Novak, MD No       Preterm labor symptoms and general obstetric precautions including but not limited to vaginal bleeding, contractions, leaking of fluid and fetal movement were reviewed in detail with the patient.  Return in about 3 weeks (  around 01/12/2017) for ROB following anatomy ultrasound.  Marcelyn Bruins, CNM 12/22/2016  11:05 AM

## 2017-01-12 ENCOUNTER — Ambulatory Visit (INDEPENDENT_AMBULATORY_CARE_PROVIDER_SITE_OTHER): Payer: BLUE CROSS/BLUE SHIELD | Admitting: Obstetrics and Gynecology

## 2017-01-12 ENCOUNTER — Ambulatory Visit (INDEPENDENT_AMBULATORY_CARE_PROVIDER_SITE_OTHER): Payer: BLUE CROSS/BLUE SHIELD

## 2017-01-12 VITALS — BP 108/68 | Wt 195.0 lb

## 2017-01-12 DIAGNOSIS — Z362 Encounter for other antenatal screening follow-up: Secondary | ICD-10-CM | POA: Diagnosis not present

## 2017-01-12 DIAGNOSIS — O99212 Obesity complicating pregnancy, second trimester: Secondary | ICD-10-CM

## 2017-01-12 DIAGNOSIS — Z3A20 20 weeks gestation of pregnancy: Secondary | ICD-10-CM

## 2017-01-12 DIAGNOSIS — O0990 Supervision of high risk pregnancy, unspecified, unspecified trimester: Secondary | ICD-10-CM

## 2017-01-12 NOTE — Progress Notes (Signed)
Routine Prenatal Care Visit  Subjective  Kathy Williamson is a 31 y.o. G2P1001 at [redacted]w[redacted]d being seen today for ongoing prenatal care.  She is currently monitored for the following issues for this high-risk pregnancy and has Irregular bleeding; Bilateral polycystic ovarian syndrome; Rh negative state in antepartum period; Infertility female; Supervision of high risk pregnancy, antepartum; Obesity affecting pregnancy; and BMI 30.0-30.9,adult on her problem list.  ----------------------------------------------------------------------------------- Patient reports URI symptoms.  Cough, non-productive and per patient mostly because of sinus congestion and postnasal drip.   Contractions: Not present. Vag. Bleeding: None.  Movement: Present. Denies leaking of fluid.  ----------------------------------------------------------------------------------- The following portions of the patient's history were reviewed and updated as appropriate: allergies, current medications, past family history, past medical history, past social history, past surgical history and problem list. Problem list updated.   Objective  Blood pressure 108/68, weight 195 lb (88.5 kg), last menstrual period 08/24/2016. Pregravid weight 197 lb (89.4 kg) Total Weight Gain -2 lb (-0.907 kg) Urinalysis: Urine Protein: Negative Urine Glucose: Negative  Fetal Status: Fetal Heart Rate (bpm): 150   Movement: Present     General:  Alert, oriented and cooperative. Patient is in no acute distress.  Skin: Skin is warm and dry. No rash noted.   Cardiovascular: Normal heart rate noted  Respiratory: Normal respiratory effort, no problems with respiration noted  Abdomen: Soft, gravid, appropriate for gestational age. Pain/Pressure: Absent     Pelvic:  Cervical exam deferred        Extremities: Normal range of motion.     ental Status: Normal mood and affect. Normal behavior. Normal judgment and thought content.     Assessment   31 y.o. G2P1001 at  [redacted]w[redacted]d by  05/31/2017, by Last Menstrual Period presenting for routine prenatal visit  Plan   pregnancy Problems (from 10/14/16 to present)    Problem Noted Resolved   Supervision of high risk pregnancy, antepartum 10/14/2016 by Conard Novak, MD No   Overview Addendum 11/24/2016 11:54 AM by Conard Novak, MD    [facility] Prenatal Labs  Dating Sure LMP, u/s pending Blood type: A/Negative/-- (07/17 1510)   Genetic Screen 1 Screen: declines  AFP:     Quad:     NIPS: Antibody:Negative (07/17 1510)  Anatomic [place]  Rubella: 2.71 (07/17 1510) Varicella:    GTT Early: 120  Third trimester:  RPR: Non Reactive (07/17 1510)   Rhogam  due 28 weeks HBsAg: Negative (07/17 1510)   TDaP vaccine                       Flu Shot: HIV:     Baby Food                                GBS:   Contraception  Pap:  CBB     CS/VBAC    Support Person               Obesity affecting pregnancy 10/14/2016 by Conard Novak, MD No   Overview Addendum 10/23/2016 10:26 AM by Conard Novak, MD     early 1 hour gtt - 120 mg/dL      BMI 30.8-65.7,QIONG 10/14/2016 by Conard Novak, MD No       Preterm labor symptoms and general obstetric precautions including but not limited to vaginal bleeding, contractions, leaking of fluid and fetal movement were reviewed in detail  with the patient. Please refer to After Visit Summary for other counseling recommendations.  -Lungs clear URI symptoms, also has history of seasonal allergies.  NO fevers or chills.  Provided with self care in pregnancy OTC medication sheet -Anatomy scan complete  Return in about 4 weeks (around 02/09/2017) for ROB.

## 2017-01-12 NOTE — Patient Instructions (Signed)
OVER THE COUNTER MEDICINES  SAFE IN PREGNANCY   COMPLAINT                                        MEDICINE                                                 Constipation Add fiber supplements such as Metamucil, Fibercon, or Citrucel.  Increasing fiber intake via bran cereals, oatmeal, leafy greens, and prunes is another alternative.  Increase you fluid intake.  You may also add Colace 100mg by mouth twice daily or Senakot   Cough/Cold      Robitusin, Mucinex  Cuts and  Scrapes Bacitracin, Neosporin, Plysporin  Dental Pain     Tylenol. Avoid aspirin  products, which  could cause bleeding problems for mother and baby.  Also avoid ibuprofen products, such as Advil, Motrin or Aleve unless your doctor or nurse specifically recommends these drugs.  Diarrhea Immodium, Kaopectate, or Donnagel PG .  Ensure adequate hydration by taking in plenty of clear liquids such as Gatorade, ginger ale, and jello.  Call if symptoms persist over 24-hrs.  Do NOT take Pepto-Bismol  Fever Take Tylenol *, Extra-Strength Tylenol *, or any aspirin-free pain reliever (acetaminophen).  Avoid aspirin products, which could cause bleeding problems for mother and baby.  Also avoid ibuprofen products, such as Advil, Motrin or Aleve unless your doctor or nurse specifically recommends these drugs.  If you temperature is over 101o F (38.3o C) call the  clinic  Gas    Gaviscon,, Mylicon,  Riopan  Headache   Tylenol. Avoid aspirin                                                                                products, which  could cause bleeding problems for mother and baby.  Also avoid ibuprofen products, such as Advil, Motrin or Aleve unless your doctor or nurse specifically recommends these drugs.  Head Lice     Nix  Heartburn/Indigestion TUMS, Rolaids, Zantac, Mylanta. Maalox   Avoid fatty, fried, or spicy foods.  Eat small frequent meals 5-6 times daily as opposed to 3 large meals  a day.  Hemorrhoids Annusol HC, Tucks , Perperation H , or Dermaplast.  Warm sitz baths for 30 minutes twice daily.  Air dry and sleep without underwear.  Avoid constipation and see recommendation above for constipation as this may exacerbate/worsen you hemorrhoids  Leg Cramps     TUMS, Vitamin with Potassium  Leg Swelling  Elevate legs above                                                                                                     waist, lay on your left side.  Well fitting  shoes and support  hose  Muscle Aches                Tylenol. Avoid aspirin products, which  could cause bleeding problems for mother and baby.  Also avoid ibuprofen products, such as Advil, Motrin or Aleve unless your doctor or nurse specifically recommends these drugs.  Nausea/Vomiting Emetrol.  Supportive measures to ensure you stay adequately hydrated.  Sips of juice, Gatorade, ginger ale 4 times an hour.  If you are unable to tolerate fluid intake for greater than 8hrs call the clinic.  You may want to allow ginger ale to go flat as carbonation my precipitate emesis.  Over the counter chewable prenatal vitamins are available.  Vitamin B6 (pyridoxine) 10 to 24mg every 8hrs and doxylamine (Unisome sleep taps) 25mg at bedtime and 12.5mg in the morning is first line.  Note that B6 supplements are not FDA regulated, there is a prescription of the above combination commercially available called Diclegis.  Nosebleeds Apply ice pack to nose.  Particularly in winter time with dryer air consider the use of a humidifier   Painful urination                 Call clinic  Rash/ Itch Benadryl, Aveeno, Cortaid.  For severe whole body itching in the late second early third trimester call the office  Sleep Problems     Benadryl, Tylenol  PM, or Unisom  Seasonal Allergies    Claritin, Zyrtec,           Benadryl, Mucinex  Sinus Congestion Tylenol with Sudafed (pseudoephedrine no phenylephrine) or  Actifed.  Sore Throat Chloroseptic lozenges and sprays such as Cepacol.  Salt water gargles (1 teaspoon of table salt in one quart of water)  Call if associated temperature above 101 Fahrenheit or 38.3 Celsius  Yeast Infection                [place]     

## 2017-01-12 NOTE — Progress Notes (Signed)
Pt c/o head cold. Anatomy scan today, it's a GIRL!

## 2017-01-30 ENCOUNTER — Telehealth: Payer: Self-pay

## 2017-01-30 NOTE — Telephone Encounter (Signed)
Pt is taking sudafed and musinex, was sweating yesterday afternoon - temp was 99.0 then.  Adv to cont sudafed, switch to robitussin, Hall's cough drops, saline nasal spray, sip hot beverages - no red raspberry tea, push fluids and rest.  If fever >100.5 or feels worse to be seen by [facility].

## 2017-01-30 NOTE — Telephone Encounter (Signed)
Pt is 23wks, this is the third time of having a bad head cold, coughing, using safe meds, worried about cough affecting baby.  Is there anything else to do?  [phone] [facility]

## 2017-02-09 ENCOUNTER — Encounter: Payer: BLUE CROSS/BLUE SHIELD | Admitting: Advanced Practice Midwife

## 2017-02-11 ENCOUNTER — Encounter: Payer: BLUE CROSS/BLUE SHIELD | Admitting: Obstetrics & Gynecology

## 2017-02-11 ENCOUNTER — Ambulatory Visit (INDEPENDENT_AMBULATORY_CARE_PROVIDER_SITE_OTHER): Payer: BLUE CROSS/BLUE SHIELD | Admitting: Obstetrics and Gynecology

## 2017-02-11 VITALS — BP 112/76 | Wt 198.0 lb

## 2017-02-11 DIAGNOSIS — O09899 Supervision of other high risk pregnancies, unspecified trimester: Secondary | ICD-10-CM

## 2017-02-11 DIAGNOSIS — Z3A24 24 weeks gestation of pregnancy: Secondary | ICD-10-CM

## 2017-02-11 DIAGNOSIS — O99212 Obesity complicating pregnancy, second trimester: Secondary | ICD-10-CM

## 2017-02-11 DIAGNOSIS — O0990 Supervision of high risk pregnancy, unspecified, unspecified trimester: Secondary | ICD-10-CM

## 2017-02-11 DIAGNOSIS — Z6791 Unspecified blood type, Rh negative: Secondary | ICD-10-CM

## 2017-02-11 DIAGNOSIS — O26899 Other specified pregnancy related conditions, unspecified trimester: Secondary | ICD-10-CM

## 2017-02-11 DIAGNOSIS — Z113 Encounter for screening for infections with a predominantly sexual mode of transmission: Secondary | ICD-10-CM

## 2017-02-11 NOTE — Progress Notes (Signed)
Routine Prenatal Care Visit  Subjective  Kathy Williamson is a 31 y.o. G2P1001 at 2182w3d being seen today for ongoing prenatal care.  She is currently monitored for the following issues for this high-risk pregnancy and has Bilateral polycystic ovarian syndrome; Rh negative state in antepartum period; Infertility female; Supervision of high risk pregnancy, antepartum; Obesity affecting pregnancy; and BMI 30.0-30.9,adult on their problem list.  ----------------------------------------------------------------------------------- Patient reports no complaints.   Contractions: Not present. Vag. Bleeding: None.  Movement: Present. Denies leaking of fluid.  ----------------------------------------------------------------------------------- The following portions of the patient's history were reviewed and updated as appropriate: allergies, current medications, past family history, past medical history, past social history, past surgical history and problem list. Problem list updated.   Objective  Blood pressure 112/76, weight 198 lb (89.8 kg), last menstrual period 08/24/2016. Pregravid weight 197 lb (89.4 kg) Total Weight Gain 1 lb (0.454 kg) Urinalysis: Urine Protein: Negative Urine Glucose: Negative  Fetal Status: Fetal Heart Rate (bpm): 145 Fundal Height: 24 cm Movement: Present     General:  Alert, oriented and cooperative. Patient is in no acute distress.  Skin: Skin is warm and dry. No rash noted.   Cardiovascular: Normal heart rate noted  Respiratory: Normal respiratory effort, no problems with respiration noted  Abdomen: Soft, gravid, appropriate for gestational age. Pain/Pressure: Absent     Pelvic:  Cervical exam deferred        Extremities: Normal range of motion.     ental Status: Normal mood and affect. Normal behavior. Normal judgment and thought content.     Assessment   31 y.o. G2P1001 at 8782w3d by  05/31/2017, by Last Menstrual Period presenting for routine prenatal visit  Plan    pregnancy Problems (from 10/14/16 to present)    Problem Noted Resolved   Supervision of high risk pregnancy, antepartum 10/14/2016 by Conard NovakJackson, Stephen D, MD No   Overview Addendum 11/24/2016 11:54 AM by Conard NovakJackson, Stephen D, MD    Clinic [facility] Prenatal Labs  Dating Sure LMP,= 8 week US Blood type: A/Negative/-- (07/17 1510)   Genetic Screen 1 Screen: declines  AFP:     Quad:     NIPS: Antibody:Negative (07/17 1510)  Anatomic US Normal Female Rubella: 2.71 (07/17 1510) Varicella:    GTT Early: 120  Third trimester:  RPR: Non Reactive (07/17 1510)   Rhogam [ ]  due 28 weeks HBsAg: Negative (07/17 1510)   TDaP vaccine                       Flu Shot: Declines HIV:   negative  Baby Food                                GBS:   Contraception  Pap: 07/13/14 NIL HPV negative  CBB     CS/VBAC    Support Person               Obesity affecting pregnancy 10/14/2016 by Conard NovakJackson, Stephen D, MD No   Overview Addendum 10/23/2016 10:26 AM by Conard NovakJackson, Stephen D, MD    [x]  early 1 hour gtt - 120 mg/dL      BMI 16.1-09.6,EAVWU30.0-30.9,adult 10/14/2016 by Conard NovakJackson, Stephen D, MD No       Preterm labor symptoms and general obstetric precautions including but not limited to vaginal bleeding, contractions, leaking of fluid and fetal movement were reviewed in detail with the patient. Please refer to After  Visit Summary for other counseling recommendations.  - 28 week land rhogam next visit - Discussed stopping metformin the week prior to 28 week labs Return in about 4 weeks (around 03/11/2017) for ROB and 28 week labs.

## 2017-02-11 NOTE — Progress Notes (Signed)
ROB Declined flu vacaine

## 2017-03-11 ENCOUNTER — Ambulatory Visit (INDEPENDENT_AMBULATORY_CARE_PROVIDER_SITE_OTHER): Payer: BLUE CROSS/BLUE SHIELD | Admitting: Advanced Practice Midwife

## 2017-03-11 ENCOUNTER — Other Ambulatory Visit: Payer: BLUE CROSS/BLUE SHIELD

## 2017-03-11 ENCOUNTER — Encounter: Payer: Self-pay | Admitting: Advanced Practice Midwife

## 2017-03-11 VITALS — BP 120/70 | Wt 203.0 lb

## 2017-03-11 DIAGNOSIS — O26899 Other specified pregnancy related conditions, unspecified trimester: Secondary | ICD-10-CM

## 2017-03-11 DIAGNOSIS — Z3A24 24 weeks gestation of pregnancy: Secondary | ICD-10-CM

## 2017-03-11 DIAGNOSIS — Z3A28 28 weeks gestation of pregnancy: Secondary | ICD-10-CM

## 2017-03-11 DIAGNOSIS — Z6791 Unspecified blood type, Rh negative: Secondary | ICD-10-CM

## 2017-03-11 DIAGNOSIS — O99212 Obesity complicating pregnancy, second trimester: Secondary | ICD-10-CM

## 2017-03-11 DIAGNOSIS — O0990 Supervision of high risk pregnancy, unspecified, unspecified trimester: Secondary | ICD-10-CM

## 2017-03-11 DIAGNOSIS — Z113 Encounter for screening for infections with a predominantly sexual mode of transmission: Secondary | ICD-10-CM

## 2017-03-11 NOTE — Patient Instructions (Addendum)
Third Trimester of Pregnancy The third trimester is from week 28 through week 40 (months 7 through 9). The third trimester is a time when the unborn baby (fetus) is growing rapidly. At the end of the ninth month, the fetus is about 20 inches in length and weighs 6-10 pounds. Body changes during your third trimester Your body will continue to go through many changes during pregnancy. The changes vary from woman to woman. During the third trimester:  Your weight will continue to increase. You can expect to gain 25-35 pounds (11-16 kg) by the end of the pregnancy.  You may begin to get stretch marks on your hips, abdomen, and breasts.  You may urinate more often because the fetus is moving lower into your pelvis and pressing on your bladder.  You may develop or continue to have heartburn. This is caused by increased hormones that slow down muscles in the digestive tract.  You may develop or continue to have constipation because increased hormones slow digestion and cause the muscles that push waste through your intestines to relax.  You may develop hemorrhoids. These are swollen veins (varicose veins) in the rectum that can itch or be painful.  You may develop swollen, bulging veins (varicose veins) in your legs.  You may have increased body aches in the pelvis, back, or thighs. This is due to weight gain and increased hormones that are relaxing your joints.  You may have changes in your hair. These can include thickening of your hair, rapid growth, and changes in texture. Some women also have hair loss during or after pregnancy, or hair that feels dry or thin. Your hair will most likely return to normal after your baby is born.  Your breasts will continue to grow and they will continue to become tender. A yellow fluid (colostrum) may leak from your breasts. This is the first milk you are producing for your baby.  Your belly button may stick out.  You may notice more swelling in your hands,  face, or ankles.  You may have increased tingling or numbness in your hands, arms, and legs. The skin on your belly may also feel numb.  You may feel short of breath because of your expanding uterus.  You may have more problems sleeping. This can be caused by the size of your belly, increased need to urinate, and an increase in your body's metabolism.  You may notice the fetus "dropping," or moving lower in your abdomen (lightening).  You may have increased vaginal discharge.  You may notice your joints feel loose and you may have pain around your pelvic bone.  What to expect at prenatal visits You will have prenatal exams every 2 weeks until week 36. Then you will have weekly prenatal exams. During a routine prenatal visit:  You will be weighed to make sure you and the baby are growing normally.  Your blood pressure will be taken.  Your abdomen will be measured to track your baby's growth.  The fetal heartbeat will be listened to.  Any test results from the previous visit will be discussed.  You may have a cervical check near your due date to see if your cervix has softened or thinned (effaced).  You will be tested for Group B streptococcus. This happens between 35 and 37 weeks.  Your health care provider may ask you:  What your birth plan is.  How you are feeling.  If you are feeling the baby move.  If you have had   any abnormal symptoms, such as leaking fluid, bleeding, severe headaches, or abdominal cramping.  If you are using any tobacco products, including cigarettes, chewing tobacco, and electronic cigarettes.  If you have any questions.  Other tests or screenings that may be performed during your third trimester include:  Blood tests that check for low iron levels (anemia).  Fetal testing to check the health, activity level, and growth of the fetus. Testing is done if you have certain medical conditions or if there are problems during the  pregnancy.  Nonstress test (NST). This test checks the health of your baby to make sure there are no signs of problems, such as the baby not getting enough oxygen. During this test, a belt is placed around your belly. The baby is made to move, and its heart rate is monitored during movement.  What is false labor? False labor is a condition in which you feel small, irregular tightenings of the muscles in the womb (contractions) that usually go away with rest, changing position, or drinking water. These are called Braxton Hicks contractions. Contractions may last for hours, days, or even weeks before true labor sets in. If contractions come at regular intervals, become more frequent, increase in intensity, or become painful, you should see your health care provider. What are the signs of labor?  Abdominal cramps.  Regular contractions that start at 10 minutes apart and become stronger and more frequent with time.  Contractions that start on the top of the uterus and spread down to the lower abdomen and back.  Increased pelvic pressure and dull back pain.  A watery or bloody mucus discharge that comes from the vagina.  Leaking of amniotic fluid. This is also known as your "water breaking." It could be a slow trickle or a gush. Let your health care provider know if it has a color or strange odor. If you have any of these signs, call your health care provider right away, even if it is before your due date. Follow these instructions at home: Medicines  Follow your health care provider's instructions regarding medicine use. Specific medicines may be either safe or unsafe to take during pregnancy.  Take a prenatal vitamin that contains at least 600 micrograms (mcg) of folic acid.  If you develop constipation, try taking a stool softener if your health care provider approves. Eating and drinking  Eat a balanced diet that includes fresh fruits and vegetables, whole grains, good sources of protein  such as meat, eggs, or tofu, and low-fat dairy. Your health care provider will help you determine the amount of weight gain that is right for you.  Avoid raw meat and uncooked cheese. These carry germs that can cause birth defects in the baby.  If you have low calcium intake from food, talk to your health care provider about whether you should take a daily calcium supplement.  Eat four or five small meals rather than three large meals a day.  Limit foods that are high in fat and processed sugars, such as fried and sweet foods.  To prevent constipation: ? Drink enough fluid to keep your urine clear or pale yellow. ? Eat foods that are high in fiber, such as fresh fruits and vegetables, whole grains, and beans. Activity  Exercise only as directed by your health care provider. Most women can continue their usual exercise routine during pregnancy. Try to exercise for 30 minutes at least 5 days a week. Stop exercising if you experience uterine contractions.  Avoid heavy   lifting.  Do not exercise in extreme heat or humidity, or at high altitudes.  Wear low-heel, comfortable shoes.  Practice good posture.  You may continue to have sex unless your health care provider tells you otherwise. Relieving pain and discomfort  Take frequent breaks and rest with your legs elevated if you have leg cramps or low back pain.  Take warm sitz baths to soothe any pain or discomfort caused by hemorrhoids. Use hemorrhoid cream if your health care provider approves.  Wear a good support bra to prevent discomfort from breast tenderness.  If you develop varicose veins: ? Wear support pantyhose or compression stockings as told by your healthcare provider. ? Elevate your feet for 15 minutes, 3-4 times a day. Prenatal care  Write down your questions. Take them to your prenatal visits.  Keep all your prenatal visits as told by your health care provider. This is important. Safety  Wear your seat belt at  all times when driving.  Make a list of emergency phone numbers, including numbers for family, friends, the hospital, and police and fire departments. General instructions  Avoid cat litter boxes and soil used by cats. These carry germs that can cause birth defects in the baby. If you have a cat, ask someone to clean the litter box for you.  Do not travel far distances unless it is absolutely necessary and only with the approval of your health care provider.  Do not use hot tubs, steam rooms, or saunas.  Do not drink alcohol.  Do not use any products that contain nicotine or tobacco, such as cigarettes and e-cigarettes. If you need help quitting, ask your health care provider.  Do not use any medicinal herbs or unprescribed drugs. These chemicals affect the formation and growth of the baby.  Do not douche or use tampons or scented sanitary pads.  Do not cross your legs for long periods of time.  To prepare for the arrival of your baby: ? Take prenatal classes to understand, practice, and ask questions about labor and delivery. ? Make a trial run to the hospital. ? Visit the hospital and tour the maternity area. ? Arrange for maternity or paternity leave through employers. ? Arrange for family and friends to take care of pets while you are in the hospital. ? Purchase a rear-facing car seat and make sure you know how to install it in your car. ? Pack your hospital bag. ? Prepare the baby's nursery. Make sure to remove all pillows and stuffed animals from the baby's crib to prevent suffocation.  Visit your dentist if you have not gone during your pregnancy. Use a soft toothbrush to brush your teeth and be gentle when you floss. Contact a health care provider if:  You are unsure if you are in labor or if your water has broken.  You become dizzy.  You have mild pelvic cramps, pelvic pressure, or nagging pain in your abdominal area.  You have lower back pain.  You have persistent  nausea, vomiting, or diarrhea.  You have an unusual or bad smelling vaginal discharge.  You have pain when you urinate. Get help right away if:  Your water breaks before 37 weeks.  You have regular contractions less than 5 minutes apart before 37 weeks.  You have a fever.  You are leaking fluid from your vagina.  You have spotting or bleeding from your vagina.  You have severe abdominal pain or cramping.  You have rapid weight loss or weight gain.    You have shortness of breath with chest pain.  You notice sudden or extreme swelling of your face, hands, ankles, feet, or legs.  Your baby makes fewer than 10 movements in 2 hours.  You have severe headaches that do not go away when you take medicine.  You have vision changes. Summary  The third trimester is from week 28 through week 40, months 7 through 9. The third trimester is a time when the unborn baby (fetus) is growing rapidly.  During the third trimester, your discomfort may increase as you and your baby continue to gain weight. You may have abdominal, leg, and back pain, sleeping problems, and an increased need to urinate.  During the third trimester your breasts will keep growing and they will continue to become tender. A yellow fluid (colostrum) may leak from your breasts. This is the first milk you are producing for your baby.  False labor is a condition in which you feel small, irregular tightenings of the muscles in the womb (contractions) that eventually go away. These are called Braxton Hicks contractions. Contractions may last for hours, days, or even weeks before true labor sets in.  Signs of labor can include: abdominal cramps; regular contractions that start at 10 minutes apart and become stronger and more frequent with time; watery or bloody mucus discharge that comes from the vagina; increased pelvic pressure and dull back pain; and leaking of amniotic fluid. This information is not intended to replace advice  given to you by your health care provider. Make sure you discuss any questions you have with your health care provider. Document Released: 03/11/2001 Document Revised: 08/23/2015 Document Reviewed: 05/18/2012 Elsevier Interactive Patient Education  2017 [employer] Rh Incompatibility Rh incompatibility is a condition that occurs during pregnancy if a woman has Rh-negative blood and her baby has Rh-positive blood. "Rh-negative" and "Rh-positive" refer to whether or not the blood has an Rh factor. An Rh factor is a specific protein found on the surface of red blood cells. If a woman has Rh factor, she is Rh-positive. If she does not have an Rh factor, she is Rh-negative. Having or not having an Rh factor does not affect the mother's general health. However, it can cause problems during pregnancy. What kind of problems can Rh incompatibility cause? During pregnancy, blood from the baby can cross into the mother's bloodstream, especially during delivery. If a mother is Rh-negative and the baby is Rh-positive, the mother's defense system will react to the baby's blood as if it was a foreign substance and will create proteins (antibodies). This is called sensitization. Once the mother is sensitized, her Rh antibodies will cross the placenta to the baby and attack the baby's Rh-positive blood as if it is a harmful substance. Rh incompatibility can also happen if the Rh-negative pregnant woman is exposed to the Rh factor during a blood transfusion with Rh-positive blood. How does this condition affect my baby? The Rh antibodies that attack and destroy the baby's red blood cells can lead to hemolytic disease in the baby. Hemolytic disease is when the red blood cells break down. This can cause:  Yellowing of the skin and eyes (jaundice).  The body to not have enough healthy red blood cells (anemia).  Brain damage.  Heart failure.  Death.  These antibodies usually do not cause problems during a first  pregnancy. This is because the blood from the baby often times crosses into the mother's bloodstream during delivery, and the baby is born before many of  the antibodies can develop. However, the antibodies stay in your body once they have formed. Because of this, Rh incompatibility is more likely to cause problems in second or later pregnancies (if the baby is Rh-positive). How is this diagnosed? When a woman becomes pregnant, blood tests may be done to find out her blood type and Rh factor. If the woman is Rh-negative, she also may have another blood test called an antibody screen. The antibody screen shows whether she has Rh antibodies in her blood. If she does, it means she was exposed to Rh-positive blood before, and she is at risk for Rh incompatibility. To find out whether the baby is developing hemolytic anemia and how serious it is, caregivers may use more advanced tests, such as ultrasonography (commonly known as ultrasound). How is Rh incompatibility treated? Rh incompatibility is treated with a shot of medicine called Rho (D) immune globulin. This medicine keeps the woman's body from making antibodies that can cause serious problems in the baby or future babies. Two shots will be given, one at around your seventh month of pregnancy and the other within 72 hours of your baby being born. If you are Rh-negative, you will need this medicine every time you have a baby with Rh-positive blood. If you already have antibodies in your blood, Rho (D) immune globulin will not help. Your doctor will not give you this medicine, but will watch your pregnancy closely for problems instead. This shot may also be given to an Rh-negative woman when the risk of blood transfer between the mom and baby is high. The risk is high with:  An amniocentesis.  A miscarriage or an abortion.  An ectopic pregnancy.  Any vaginal bleeding during pregnancy.  This information is not intended to replace advice given to you by  your health care provider. Make sure you discuss any questions you have with your health care provider. Document Released: 09/06/2001 Document Revised: 08/23/2015 Document Reviewed: 06/29/2012 Elsevier Interactive Patient Education  2017 [employer]

## 2017-03-11 NOTE — Progress Notes (Signed)
Routine Prenatal Care Visit  Subjective  Kathy Williamson is a 31 y.o. G2P1001 at 3858w3d being seen today for ongoing prenatal care.  She is currently monitored for the following issues for this high-risk pregnancy and has Bilateral polycystic ovarian syndrome; Rh negative state in antepartum period; Infertility female; Supervision of high risk pregnancy, antepartum; Obesity affecting pregnancy; and BMI 30.0-30.9,adult on their problem list.  ----------------------------------------------------------------------------------- Patient reports no complaints.    She is refusing the 28 wk Rhogam injection today due to reaction she had following her last rhogam injection. She describes a flu like illness with muscle spasms following the injection. Discussion today of Rhogam- reason for prophylactic dose, statistical possibilities of sensitization, possible alternative preparations of Rh immune globulin, find out FOBs Rh status.   Contractions: Not present.  .  Movement: Present. Denies leaking of fluid.  ----------------------------------------------------------------------------------- The following portions of the patient's history were reviewed and updated as appropriate: allergies, current medications, past family history, past medical history, past social history, past surgical history and problem list. Problem list updated.   Objective  Blood pressure 120/70, weight 203 lb (92.1 kg), last menstrual period 08/24/2016. Pregravid weight 197 lb (89.4 kg) Total Weight Gain 6 lb (2.722 kg) Urinalysis: Urine Protein: Negative Urine Glucose: Negative  Fetal Status: Fetal Heart Rate (bpm): 156 Fundal Height: 29 cm Movement: Present     General:  Alert, oriented and cooperative. Patient is in no acute distress.  Skin: Skin is warm and dry. No rash noted.   Cardiovascular: Normal heart rate noted  Respiratory: Normal respiratory effort, no problems with respiration noted  Abdomen: Soft, gravid, appropriate  for gestational age. Pain/Pressure: Absent     Pelvic:  Cervical exam deferred        Extremities: Normal range of motion.     Mental Status: Normal mood and affect. Normal behavior. Normal judgment and thought content.   Assessment   31 y.o. G2P1001 at 6258w3d by  05/31/2017, by Last Menstrual Period presenting for routine prenatal visit  Plan   pregnancy Problems (from 10/14/16 to present)    Problem Noted Resolved   Supervision of high risk pregnancy, antepartum 10/14/2016 by Conard NovakJackson, Stephen D, MD No   Overview Addendum 02/11/2017 10:54 AM by Vena AustriaStaebler, Andreas, MD    Clinic [facility] Prenatal Labs  Dating Sure LMP = 8 week US Blood type: A/Negative/-- (07/17 1510)   Genetic Screen 1 Screen: declines  Antibody:Negative (07/17 1510)  Anatomic US Normal Female Rubella: 2.71 (07/17 1510) Immune Varicella: Immune  GTT Early: 120  Third trimester:  RPR: Non Reactive (07/17 1510)   Rhogam [ ]  due 28 weeks HBsAg: Negative (07/17 1510)   TDaP vaccine                       Flu Shot: Declines HIV: negative  Baby Food                                GBS:   Contraception  Pap: 07/13/14 NIL HPV negative  CBB     CS/VBAC    Support Person               Obesity affecting pregnancy 10/14/2016 by Conard NovakJackson, Stephen D, MD No   Overview Addendum 10/23/2016 10:26 AM by Conard NovakJackson, Stephen D, MD    [x]  early 1 hour gtt - 120 mg/dL      BMI 13.0-86.5,HQION30.0-30.9,adult 10/14/2016 by Conard NovakJackson, Stephen D,  MD No       Preterm labor symptoms and general obstetric precautions including but not limited to vaginal bleeding, contractions, leaking of fluid and fetal movement were reviewed in detail with the patient. Please refer to After Visit Summary for other counseling recommendations including 3rd trimester information and Rh status information.   Inform clinic in the next week of desire for alternative sourcing of Rh immune globulin so we can order it  Return in about 2 weeks (around 03/25/2017) for rob.  Tresea MallJane Teghan Philbin,  CNM  03/11/2017 12:14 PM

## 2017-03-11 NOTE — Progress Notes (Signed)
No concerns.rj 

## 2017-03-12 ENCOUNTER — Encounter: Payer: Self-pay | Admitting: Obstetrics and Gynecology

## 2017-03-12 LAB — 28 WEEKS RH-PANEL
ANTIBODY SCREEN: NEGATIVE
BASOS ABS: 0 10*3/uL (ref 0.0–0.2)
BASOS: 0 %
EOS (ABSOLUTE): 0.1 10*3/uL (ref 0.0–0.4)
EOS: 1 %
GESTATIONAL DIABETES SCREEN: 111 mg/dL (ref 65–139)
HEMOGLOBIN: 11.5 g/dL (ref 11.1–15.9)
HIV SCREEN 4TH GENERATION: NONREACTIVE
Hematocrit: 36.5 % (ref 34.0–46.6)
IMMATURE GRANULOCYTES: 0 %
Immature Grans (Abs): 0 10*3/uL (ref 0.0–0.1)
Lymphocytes Absolute: 1.6 10*3/uL (ref 0.7–3.1)
Lymphs: 19 %
MCH: 28.3 pg (ref 26.6–33.0)
MCHC: 31.5 g/dL (ref 31.5–35.7)
MCV: 90 fL (ref 79–97)
MONOCYTES: 9 %
Monocytes Absolute: 0.7 10*3/uL (ref 0.1–0.9)
Neutrophils Absolute: 6.2 10*3/uL (ref 1.4–7.0)
Neutrophils: 71 %
PLATELETS: 233 10*3/uL (ref 150–379)
RBC: 4.06 x10E6/uL (ref 3.77–5.28)
RDW: 13.8 % (ref 12.3–15.4)
RPR: NONREACTIVE
WBC: 8.7 10*3/uL (ref 3.4–10.8)

## 2017-03-25 ENCOUNTER — Ambulatory Visit (INDEPENDENT_AMBULATORY_CARE_PROVIDER_SITE_OTHER): Payer: BLUE CROSS/BLUE SHIELD | Admitting: Obstetrics and Gynecology

## 2017-03-25 VITALS — BP 118/66 | Wt 206.0 lb

## 2017-03-25 DIAGNOSIS — O0990 Supervision of high risk pregnancy, unspecified, unspecified trimester: Secondary | ICD-10-CM

## 2017-03-25 DIAGNOSIS — Z6791 Unspecified blood type, Rh negative: Secondary | ICD-10-CM

## 2017-03-25 DIAGNOSIS — Z3A30 30 weeks gestation of pregnancy: Secondary | ICD-10-CM

## 2017-03-25 DIAGNOSIS — O99212 Obesity complicating pregnancy, second trimester: Secondary | ICD-10-CM

## 2017-03-25 DIAGNOSIS — O26899 Other specified pregnancy related conditions, unspecified trimester: Principal | ICD-10-CM

## 2017-03-25 DIAGNOSIS — O09899 Supervision of other high risk pregnancies, unspecified trimester: Secondary | ICD-10-CM

## 2017-03-25 NOTE — Progress Notes (Signed)
Routine Prenatal Care Visit  Subjective  Kathy Williamson is a 31 y.o. G2P1001 at 2250w3d being seen today for ongoing prenatal care.  She is currently monitored for the following issues for this low-risk pregnancy and has Bilateral polycystic ovarian syndrome; Rh negative state in antepartum period; Infertility female; Supervision of high risk pregnancy, antepartum; Obesity affecting pregnancy; and BMI 30.0-30.9,adult on their problem list.  ----------------------------------------------------------------------------------- Patient reports no complaints.   Contractions: Not present. Vag. Bleeding: None.  Movement: Present. Denies leaking of fluid.  ----------------------------------------------------------------------------------- The following portions of the patient's history were reviewed and updated as appropriate: allergies, current medications, past family history, past medical history, past social history, past surgical history and problem list. Problem list updated.   Objective  Blood pressure 118/66, weight 206 lb (93.4 kg), last menstrual period 08/24/2016. Pregravid weight 197 lb (89.4 kg) Total Weight Gain 9 lb (4.082 kg) Urinalysis: Urine Protein: Negative Urine Glucose: Negative  Fetal Status: Fetal Heart Rate (bpm): 150 Fundal Height: 30 cm Movement: Present  Presentation: Vertex  General:  Alert, oriented and cooperative. Patient is in no acute distress.  Skin: Skin is warm and dry. No rash noted.   Cardiovascular: Normal heart rate noted  Respiratory: Normal respiratory effort, no problems with respiration noted  Abdomen: Soft, gravid, appropriate for gestational age. Pain/Pressure: Absent     Pelvic:  Cervical exam deferred        Extremities: Normal range of motion.     ental Status: Normal mood and affect. Normal behavior. Normal judgment and thought content.     Assessment   31 y.o. G2P1001 at 8550w3d by  05/31/2017, by Last Menstrual Period presenting for routine  prenatal visit  Plan   pregnancy Problems (from 10/14/16 to present)    Problem Noted Resolved   Supervision of high risk pregnancy, antepartum 10/14/2016 by Conard NovakJackson, Stephen D, MD No   Overview Addendum 03/12/2017  9:34 PM by Vena AustriaStaebler, Trestin Vences, MD    Clinic [facility] Prenatal Labs  Dating Sure LMP = 8 week US Blood type: A/Negative/-- (07/17 1510)   Genetic Screen 1 Screen: declines  Antibody:Negative (07/17 1510)  Anatomic US Normal Female Rubella: 2.71 (07/17 1510) Immune Varicella: Immune  GTT Early: 120  Third trimester: 111 RPR: Non Reactive (07/17 1510)   Rhogam [ ]  due 28 weeks HBsAg: Negative (07/17 1510)   TDaP vaccine                       Flu Shot: Declines HIV: negative  Baby Food                                GBS:   Contraception  Pap: 07/13/14 NIL HPV negative  CBB     CS/VBAC    Support Person               Obesity affecting pregnancy 10/14/2016 by Conard NovakJackson, Stephen D, MD No   Overview Addendum 10/23/2016 10:26 AM by Conard NovakJackson, Stephen D, MD    [x]  early 1 hour gtt - 120 mg/dL      BMI 40.9-81.1,BJYNW30.0-30.9,adult 10/14/2016 by Conard NovakJackson, Stephen D, MD No       Preterm labor symptoms and general obstetric precautions including but not limited to vaginal bleeding, contractions, leaking of fluid and fetal movement were reviewed in detail with the patient. Please refer to After Visit Summary for other counseling recommendations.  - discussed relatively rarity of  anti-D allergic reaction, rophylac undergoes ion exchange chromatography and is considered a purer product than rhogam.  FOB awaiting testing - reports shoulder dystocia G1 obtain growth scan at 36 weeks  Return in about 2 weeks (around 04/08/2017) for ROB. '

## 2017-03-25 NOTE — Progress Notes (Signed)
ROB Declined TDAP 

## 2017-03-31 NOTE — L&D Delivery Note (Signed)
Delivery Note At 8:56 PM a viable female was delivered via Vaginal, Spontaneous (Presentation: LOA ).  APGAR: pending; weight pending.   Placenta status:spontaneous and intact  Cord: 3VC with nuchal cord x 2 without complications.  Cord pH: N/A  Anesthesia:  Epidural Episiotomy: None Lacerations: None Suture Repair: none Est. Blood Loss (mL):  400mL  Mom to postpartum.  Baby to Couplet care / Skin to Skin.  Kathy Williamson 05/26/2017, 9:14 PM

## 2017-04-05 ENCOUNTER — Other Ambulatory Visit: Payer: Self-pay | Admitting: Obstetrics and Gynecology

## 2017-04-09 ENCOUNTER — Ambulatory Visit (INDEPENDENT_AMBULATORY_CARE_PROVIDER_SITE_OTHER): Payer: BLUE CROSS/BLUE SHIELD | Admitting: Obstetrics and Gynecology

## 2017-04-09 VITALS — BP 120/68 | Wt 207.0 lb

## 2017-04-09 DIAGNOSIS — O26899 Other specified pregnancy related conditions, unspecified trimester: Principal | ICD-10-CM

## 2017-04-09 DIAGNOSIS — O99212 Obesity complicating pregnancy, second trimester: Secondary | ICD-10-CM

## 2017-04-09 DIAGNOSIS — O0990 Supervision of high risk pregnancy, unspecified, unspecified trimester: Secondary | ICD-10-CM

## 2017-04-09 DIAGNOSIS — Z6791 Unspecified blood type, Rh negative: Secondary | ICD-10-CM

## 2017-04-09 DIAGNOSIS — Z3A32 32 weeks gestation of pregnancy: Secondary | ICD-10-CM

## 2017-04-09 DIAGNOSIS — O09899 Supervision of other high risk pregnancies, unspecified trimester: Secondary | ICD-10-CM

## 2017-04-09 NOTE — Progress Notes (Signed)
    Routine Prenatal Care Visit  Subjective  Kathy Williamson is a 32 y.o. G2P1001 at 7565w4d being seen today for ongoing prenatal care.  She is currently monitored for the following issues for this high-risk pregnancy and has Bilateral polycystic ovarian syndrome; Rh negative state in antepartum period; Infertility female; Supervision of high risk pregnancy, antepartum; Obesity affecting pregnancy; and BMI 30.0-30.9,adult on their problem list.  ----------------------------------------------------------------------------------- Patient reports no complaints.   Contractions: Not present. Vag. Bleeding: None.  Movement: Present. Denies leaking of fluid.  ----------------------------------------------------------------------------------- The following portions of the patient's history were reviewed and updated as appropriate: allergies, current medications, past family history, past medical history, past social history, past surgical history and problem list. Problem list updated.   Objective  Blood pressure 120/68, weight 207 lb (93.9 kg), last menstrual period 08/24/2016. Pregravid weight 197 lb (89.4 kg) Total Weight Gain 10 lb (4.536 kg) Urinalysis:      Fetal Status: Fetal Heart Rate (bpm): 145 Fundal Height: 31 cm Movement: Present  Presentation: Vertex  General:  Alert, oriented and cooperative. Patient is in no acute distress.  Skin: Skin is warm and dry. No rash noted.   Cardiovascular: Normal heart rate noted  Respiratory: Normal respiratory effort, no problems with respiration noted  Abdomen: Soft, gravid, appropriate for gestational age. Pain/Pressure: Absent     Pelvic:  Cervical exam deferred        Extremities: Normal range of motion.     ental Status: Normal mood and affect. Normal behavior. Normal judgment and thought content.     Assessment   32 y.o. G2P1001 at 3365w4d by  05/31/2017, by Last Menstrual Period presenting for routine prenatal visit  Plan   pregnancy Problems  (from 10/14/16 to present)    Problem Noted Resolved   Supervision of high risk pregnancy, antepartum 10/14/2016 by Conard NovakJackson, Stephen D, MD No   Overview Addendum 04/09/2017  4:07 PM by Vena AustriaStaebler, Tran Arzuaga, MD    [facility]inic [facility] Prenatal Labs  Dating Sure LMP = 8 week US Blood type: A/Negative/-- (07/17 1510)   Genetic Screen 1 Screen: declines  Antibody:Negative (07/17 1510)  Anatomic US Normal Female Rubella: 2.71 (07/17 1510) Immune Varicella: Immune  GTT Early: 120  Third trimester: 111 RPR: Non Reactive (07/17 1510)   Rhogam Declined counseled about future pregnancy risk if FOB Rh positive HBsAg: Negative (07/17 1510)   TDaP vaccine Declined Flu Shot: Declines HIV: negative  Baby Food                                GBS:   Contraception  Pap: 07/13/14 NIL HPV negative  CBB     CS/VBAC    Support Person               Obesity affecting pregnancy 10/14/2016 by Conard NovakJackson, Stephen D, MD No   Overview Addendum 10/23/2016 10:26 AM by Conard NovakJackson, Stephen D, MD    [x]  early 1 hour gtt - 120 mg/dL      BMI 78.2-95.6,OZHYQ30.0-30.9,adult 10/14/2016 by Conard NovakJackson, Stephen D, MD No       Preterm labor symptoms and general obstetric precautions including but not limited to vaginal bleeding, contractions, leaking of fluid and fetal movement were reviewed in detail with the patient. Please refer to After Visit Summary for other counseling recommendations.   Return in about 2 weeks (around 04/23/2017) for ROB.

## 2017-04-09 NOTE — Progress Notes (Signed)
ROB

## 2017-04-27 ENCOUNTER — Ambulatory Visit (INDEPENDENT_AMBULATORY_CARE_PROVIDER_SITE_OTHER): Payer: BLUE CROSS/BLUE SHIELD | Admitting: Obstetrics and Gynecology

## 2017-04-27 VITALS — BP 104/68 | Wt 206.0 lb

## 2017-04-27 DIAGNOSIS — O99212 Obesity complicating pregnancy, second trimester: Secondary | ICD-10-CM

## 2017-04-27 DIAGNOSIS — Z3A35 35 weeks gestation of pregnancy: Secondary | ICD-10-CM

## 2017-04-27 DIAGNOSIS — O09899 Supervision of other high risk pregnancies, unspecified trimester: Secondary | ICD-10-CM

## 2017-04-27 DIAGNOSIS — O26899 Other specified pregnancy related conditions, unspecified trimester: Secondary | ICD-10-CM

## 2017-04-27 DIAGNOSIS — Z6791 Unspecified blood type, Rh negative: Secondary | ICD-10-CM

## 2017-04-27 DIAGNOSIS — J0110 Acute frontal sinusitis, unspecified: Secondary | ICD-10-CM

## 2017-04-27 DIAGNOSIS — O0990 Supervision of high risk pregnancy, unspecified, unspecified trimester: Secondary | ICD-10-CM

## 2017-04-27 MED ORDER — AMOXICILLIN-POT CLAVULANATE 875-125 MG PO TABS: 1.0000 | ORAL_TABLET | Freq: Two times a day (BID) | ORAL | 0 refills | Status: AC

## 2017-04-27 NOTE — Progress Notes (Signed)
ROB Sinus S&S

## 2017-04-28 NOTE — Progress Notes (Signed)
    Routine Prenatal Care Visit  Subjective  Kathy Williamson is a 32 y.o. G2P1001 at 5526w2d being seen today for ongoing prenatal care.  She is currently monitored for the following issues for this high-risk pregnancy and has Bilateral polycystic ovarian syndrome; Rh negative state in antepartum period; Infertility female; Supervision of high risk pregnancy, antepartum; Obesity affecting pregnancy; and BMI 30.0-30.9,adult on their problem list.  ----------------------------------------------------------------------------------- Patient reports sinusitis symptoms past 5 days.   Contractions: Irregular. Vag. Bleeding: None.  Movement: Present. Denies leaking of fluid.  ----------------------------------------------------------------------------------- The following portions of the patient's history were reviewed and updated as appropriate: allergies, current medications, past family history, past medical history, past social history, past surgical history and problem list. Problem list updated.   Objective  Blood pressure 104/68, weight 206 lb (93.4 kg), last menstrual period 08/24/2016. Pregravid weight 197 lb (89.4 kg) Total Weight Gain 9 lb (4.082 kg) Urinalysis:      Fetal Status: Fetal Heart Rate (bpm): 155 Fundal Height: 36 cm Movement: Present  Presentation: Vertex  General:  Alert, oriented and cooperative. Patient is in no acute distress.  Skin: Skin is warm and dry. No rash noted.   Cardiovascular: Normal heart rate noted  Respiratory: Normal respiratory effort, no problems with respiration noted, CTAB  Abdomen: Soft, gravid, appropriate for gestational age. Pain/Pressure: Present     Pelvic:  Cervical exam deferred        Extremities: Normal range of motion.     ental Status: Normal mood and affect. Normal behavior. Normal judgment and thought content.     Assessment   32 y.o. G2P1001 at 5226w2d by  05/31/2017, by Last Menstrual Period presenting for routine prenatal visit  Plan    pregnancy Problems (from 10/14/16 to present)    Problem Noted Resolved   Supervision of high risk pregnancy, antepartum 10/14/2016 by Conard NovakJackson, Stephen D, MD No   Overview Addendum 04/09/2017  4:07 PM by Vena AustriaStaebler, Crescent Gotham, MD    [facility] Prenatal Labs  Dating Sure LMP = 8 week US Blood type: A/Negative/-- (07/17 1510)   Genetic Screen 1 Screen: declines  Antibody:Negative (07/17 1510)  Anatomic US Normal Female Rubella: 2.71 (07/17 1510) Immune Varicella: Immune  GTT Early: 120  Third trimester: 111 RPR: Non Reactive (07/17 1510)   Rhogam Declined counseled about future pregnancy risk if FOB Rh positive HBsAg: Negative (07/17 1510)   TDaP vaccine Declined Flu Shot: Declines HIV: negative  Baby Food                                GBS:   Contraception  Pap: 07/13/14 NIL HPV negative  CBB     CS/VBAC    Support Person               Obesity affecting pregnancy 10/14/2016 by Conard NovakJackson, Stephen D, MD No   Overview Addendum 10/23/2016 10:26 AM by Conard NovakJackson, Stephen D, MD    [x]  early 1 hour gtt - 120 mg/dL      BMI 16.1-09.6,EAVWU30.0-30.9,adult 10/14/2016 by Conard NovakJackson, Stephen D, MD No       Term labor symptoms and general obstetric precautions including but not limited to vaginal bleeding, contractions, leaking of fluid and fetal movement were reviewed in detail with the patient. Please refer to After Visit Summary for other counseling recommendations.  - Rx Augmentin for sinusitis  Return in about 1 week (around 05/04/2017) for ROB.

## 2017-05-04 ENCOUNTER — Ambulatory Visit (INDEPENDENT_AMBULATORY_CARE_PROVIDER_SITE_OTHER): Payer: BLUE CROSS/BLUE SHIELD | Admitting: Obstetrics & Gynecology

## 2017-05-04 ENCOUNTER — Encounter: Payer: BLUE CROSS/BLUE SHIELD | Admitting: Obstetrics and Gynecology

## 2017-05-04 VITALS — BP 102/60 | Wt 206.0 lb

## 2017-05-04 DIAGNOSIS — O0990 Supervision of high risk pregnancy, unspecified, unspecified trimester: Secondary | ICD-10-CM

## 2017-05-04 DIAGNOSIS — Z3A36 36 weeks gestation of pregnancy: Secondary | ICD-10-CM

## 2017-05-04 DIAGNOSIS — O99213 Obesity complicating pregnancy, third trimester: Secondary | ICD-10-CM

## 2017-05-04 LAB — OB RESULTS CONSOLE GBS: STREP GROUP B AG: NEGATIVE

## 2017-05-04 NOTE — Progress Notes (Signed)
PNV, FMC, GBS, Labor precautions Annamarie MajorPaul Lavan Imes, MD, Merlinda FrederickFACOG [facility], [facility] 05/04/2017  4:54 PM

## 2017-05-08 LAB — CULTURE, BETA STREP (GROUP B ONLY): Strep Gp B Culture: NEGATIVE

## 2017-05-11 ENCOUNTER — Ambulatory Visit (INDEPENDENT_AMBULATORY_CARE_PROVIDER_SITE_OTHER): Payer: BLUE CROSS/BLUE SHIELD | Admitting: Maternal Newborn

## 2017-05-11 ENCOUNTER — Encounter: Payer: Self-pay | Admitting: Maternal Newborn

## 2017-05-11 VITALS — BP 100/60 | Wt 208.0 lb

## 2017-05-11 DIAGNOSIS — Z3A37 37 weeks gestation of pregnancy: Secondary | ICD-10-CM

## 2017-05-11 DIAGNOSIS — O0990 Supervision of high risk pregnancy, unspecified, unspecified trimester: Secondary | ICD-10-CM

## 2017-05-11 DIAGNOSIS — Z8759 Personal history of other complications of pregnancy, childbirth and the puerperium: Secondary | ICD-10-CM

## 2017-05-11 NOTE — Progress Notes (Signed)
    Routine Prenatal Care Visit  Subjective  Kathy Williamson is a 32 y.o. G2P1001 at 7119w1d being seen today for ongoing prenatal care.  She is currently monitored for the following issues for this high-risk pregnancy and has Bilateral polycystic ovarian syndrome; Rh negative state in antepartum period; Infertility female; Supervision of high risk pregnancy, antepartum; Obesity affecting pregnancy; and BMI 30.0-30.9,adult on their problem list.  ----------------------------------------------------------------------------------- Patient reports backache.   Contractions: Irregular. Vag. Bleeding: None.  Movement: Present. Denies leaking of fluid.  ----------------------------------------------------------------------------------- The following portions of the patient's history were reviewed and updated as appropriate: allergies, current medications, past family history, past medical history, past social history, past surgical history and problem list. Problem list updated.   Objective  Blood pressure 100/60, weight 208 lb (94.3 kg), last menstrual period 08/24/2016. Pregravid weight 197 lb (89.4 kg) Total Weight Gain 11 lb (4.99 kg) Urinalysis: Urine Protein: Negative Urine Glucose: Negative  Fetal Status: Fetal Heart Rate (bpm): 150 Fundal Height: 36 cm Movement: Present     General:  Alert, oriented and cooperative. Patient is in no acute distress.  Skin: Skin is warm and dry. No rash noted.   Cardiovascular: Normal heart rate noted  Respiratory: Normal respiratory effort, no problems with respiration noted  Abdomen: Soft, gravid, appropriate for gestational age. Pain/Pressure: Present     Pelvic:  Cervical exam deferred        Extremities: Normal range of motion.     Mental Status: Normal mood and affect. Normal behavior. Normal judgment and thought content.     Assessment   32 y.o. G2P1001 at 5219w1d, EDD 05/31/2017 by Last Menstrual Period presenting for routine prenatal visit.  Plan    pregnancy Problems (from 10/14/16 to present)    Problem Noted Resolved   Supervision of high risk pregnancy, antepartum 10/14/2016 by Conard NovakJackson, Stephen D, MD No   Overview Addendum 04/09/2017  4:07 PM by Vena AustriaStaebler, Andreas, MD    [facility] Prenatal Labs  Dating Sure LMP = 8 week US Blood type: A/Negative/-- (07/17 1510)   Genetic Screen 1 Screen: declines  Antibody:Negative (07/17 1510)  Anatomic US Normal Female Rubella: 2.71 (07/17 1510) Immune Varicella: Immune  GTT Early: 120  Third trimester: 111 RPR: Non Reactive (07/17 1510)   Rhogam Declined counseled about future pregnancy risk if FOB Rh positive HBsAg: Negative (07/17 1510)   TDaP vaccine Declined Flu Shot: Declines HIV: negative  Baby Food                                GBS:   Contraception  Pap: 07/13/14 NIL HPV negative  CBB     CS/VBAC    Support Person               Obesity affecting pregnancy 10/14/2016 by Conard NovakJackson, Stephen D, MD No   Overview Addendum 10/23/2016 10:26 AM by Conard NovakJackson, Stephen D, MD    [x]  early 1 hour gtt - 120 mg/dL      BMI 16.1-09.6,EAVWU30.0-30.9,adult 10/14/2016 by Conard NovakJackson, Stephen D, MD No    Growth US next visit. Occasional irregular contractions. Declines cervical exam today.   Term labor symptoms and general obstetric precautions including but not limited to vaginal bleeding, contractions, leaking of fluid and fetal movement were reviewed in detail with the patient.  Return in about 1 week (around 05/18/2017) for ROB and growth ultrasound.  Kathy Williamson, CNM 05/11/2017  4:54 PM

## 2017-05-21 ENCOUNTER — Ambulatory Visit
Admission: RE | Admit: 2017-05-21 | Discharge: 2017-05-21 | Disposition: A | Discharge status: Home / Self Care | Payer: BLUE CROSS/BLUE SHIELD | Source: Ambulatory Visit | Attending: Certified Nurse Midwife | Admitting: Certified Nurse Midwife

## 2017-05-21 ENCOUNTER — Ambulatory Visit (INDEPENDENT_AMBULATORY_CARE_PROVIDER_SITE_OTHER): Payer: BLUE CROSS/BLUE SHIELD | Admitting: Certified Nurse Midwife

## 2017-05-21 ENCOUNTER — Encounter: Payer: Self-pay | Admitting: Certified Nurse Midwife

## 2017-05-21 ENCOUNTER — Other Ambulatory Visit: Payer: Self-pay | Admitting: Certified Nurse Midwife

## 2017-05-21 ENCOUNTER — Encounter: Payer: BLUE CROSS/BLUE SHIELD | Admitting: Certified Nurse Midwife

## 2017-05-21 ENCOUNTER — Other Ambulatory Visit: Payer: BLUE CROSS/BLUE SHIELD

## 2017-05-21 VITALS — BP 104/64 | Wt 209.0 lb

## 2017-05-21 DIAGNOSIS — O09299 Supervision of pregnancy with other poor reproductive or obstetric history, unspecified trimester: Secondary | ICD-10-CM

## 2017-05-21 DIAGNOSIS — O0990 Supervision of high risk pregnancy, unspecified, unspecified trimester: Secondary | ICD-10-CM

## 2017-05-21 DIAGNOSIS — Z3A38 38 weeks gestation of pregnancy: Secondary | ICD-10-CM

## 2017-05-21 DIAGNOSIS — Z3A37 37 weeks gestation of pregnancy: Secondary | ICD-10-CM | POA: Diagnosis not present

## 2017-05-21 DIAGNOSIS — O09293 Supervision of pregnancy with other poor reproductive or obstetric history, third trimester: Secondary | ICD-10-CM | POA: Insufficient documentation

## 2017-05-21 DIAGNOSIS — O3663X0 Maternal care for excessive fetal growth, third trimester, not applicable or unspecified: Secondary | ICD-10-CM | POA: Insufficient documentation

## 2017-05-21 NOTE — Progress Notes (Signed)
Pt c/o swelling in hands and feet with numbness in forearms and hands, tingling in calf and foot. Pt concerned about need for induction due to hx of shoulder dystocia and 3rd degree laceration. Pt also c/o difficulty sleeping and constipation due to lower pelvic pressure.

## 2017-05-21 NOTE — Progress Notes (Signed)
HROB at 38 wk 4 d presents for ROB with some concerns. Her growth scan was cancelled for today. Apparently has a history of shoulder dystocia and a third degree laceration with her first baby who weighed 8#4oz Her labor was induced with her first baby for PUPPS at 38 weeks. She denies use of vacuum or forceps with delivery. Does not remember having suprapubic pressure with her first baby.  "The doctor told me to push one more time." No hx of brachial palsy or permanent injury to baby. She also complains of CT like sx in both her hands (swelling and numbness) and swelling in her lower extremities. Is experiencing a lot of pelvic pressure when she is up on her feet and/or walking. +FM  Long discussion with patient about induction of labor and risks of repeat shoulder dystocia (hypoxia, permanent injury with brachial plexus palsy) and repeat third degree laceration (poor healing, development of fistula). Aware that IOL would not be before 39 weeks. Bishop score is 5.5 Offered patient Cesarean section. She would like to think about options and would like to get EFW. Explained that the EFW can be off by a pound or more at term.   [facility] at 2:30. Will call patient with results and discuss POM. Recommend wrist braces while sleeping Farrel Connersolleen Cache Bills, CNM

## 2017-05-22 NOTE — Progress Notes (Signed)
This patient has a history of a shoulder dystocia and a ?third degree laceration with her first baby that weighed 8#4oz. [facility] available. Had a growth scan yesterday at 38wk4d and EFW 8#6oz. Counseled regarding IOL vs Cesarean section. She wants IOL. First opening after 39 weeks is Tuesday when you are on call. I have penciled her in for 0500. HAve not called her with the date yet. Do you want to talk with her further regarding this IOL? If so do you want me to schedule her with yoy in office or do you want to call her? Estée LauderColleen

## 2017-05-22 NOTE — Progress Notes (Signed)
As per our conversation, patient notified of IOL 26 Feb at 0500.

## 2017-05-23 ENCOUNTER — Telehealth: Payer: Self-pay | Admitting: Certified Nurse Midwife

## 2017-05-23 NOTE — Telephone Encounter (Signed)
Called patient with results of ultrasound yesterday. EFW 3917gm (8#6oz). Discussed case with DR's Jean RosenthalJackson and Bonney AidStaebler. Patient wanting IOL and declines Cesarean section after extensive counseling regarding the possibility of repeat shoulder dystocia (and possible permanent defect like brachial palsy) and third degree laceration. Per my conversation with the the physicians, will schedule induction for Tuesday 05/26/2017 at 0500 (39wk2d-first available opening).

## 2017-05-26 ENCOUNTER — Inpatient Hospital Stay
Admission: EM | Admit: 2017-05-26 | Discharge: 2017-05-28 | DRG: 807 | Disposition: A | Discharge status: Home / Self Care | Payer: BLUE CROSS/BLUE SHIELD | Attending: Obstetrics and Gynecology | Admitting: Obstetrics and Gynecology

## 2017-05-26 ENCOUNTER — Inpatient Hospital Stay: Payer: BLUE CROSS/BLUE SHIELD | Admitting: Anesthesiology

## 2017-05-26 ENCOUNTER — Other Ambulatory Visit: Payer: Self-pay

## 2017-05-26 DIAGNOSIS — O99214 Obesity complicating childbirth: Secondary | ICD-10-CM | POA: Diagnosis present

## 2017-05-26 DIAGNOSIS — Z3A39 39 weeks gestation of pregnancy: Secondary | ICD-10-CM

## 2017-05-26 DIAGNOSIS — O26893 Other specified pregnancy related conditions, third trimester: Principal | ICD-10-CM | POA: Diagnosis present

## 2017-05-26 DIAGNOSIS — E669 Obesity, unspecified: Secondary | ICD-10-CM | POA: Diagnosis present

## 2017-05-26 LAB — CBC
HCT: 37.4 % (ref 35.0–47.0)
Hemoglobin: 12.8 g/dL (ref 12.0–16.0)
MCH: 29.8 pg (ref 26.0–34.0)
MCHC: 34.2 g/dL (ref 32.0–36.0)
MCV: 87.2 fL (ref 80.0–100.0)
PLATELETS: 181 10*3/uL (ref 150–440)
RBC: 4.29 MIL/uL (ref 3.80–5.20)
RDW: 14.1 % (ref 11.5–14.5)
WBC: 9.7 10*3/uL (ref 3.6–11.0)

## 2017-05-26 LAB — TYPE AND SCREEN
ABO/RH(D): A NEG
Antibody Screen: NEGATIVE

## 2017-05-26 MED ORDER — IBUPROFEN 600 MG PO TABS
600.0000 mg | ORAL_TABLET | Freq: Four times a day (QID) | ORAL | Status: DC
  Administered 2017-05-27 – 2017-05-28 (×6): 600 mg via ORAL
  Filled 2017-05-26 (×6): qty 1

## 2017-05-26 MED ORDER — OXYTOCIN 40 UNITS IN LACTATED RINGERS INFUSION - SIMPLE MED
1.0000 m[IU]/min | INTRAVENOUS | Status: DC
  Administered 2017-05-26: 2 m[IU]/min via INTRAVENOUS

## 2017-05-26 MED ORDER — FENTANYL 2.5 MCG/ML W/ROPIVACAINE 0.15% IN NS 100 ML EPIDURAL (ARMC)
12.0000 mL/h | EPIDURAL | Status: DC
  Administered 2017-05-26: 12 mL/h via EPIDURAL
  Filled 2017-05-26: qty 100

## 2017-05-26 MED ORDER — PHENYLEPHRINE 40 MCG/ML (10ML) SYRINGE FOR IV PUSH (FOR BLOOD PRESSURE SUPPORT)
80.0000 ug | PREFILLED_SYRINGE | INTRAVENOUS | Status: DC | PRN
  Filled 2017-05-26: qty 5

## 2017-05-26 MED ORDER — AMMONIA AROMATIC IN INHA
RESPIRATORY_TRACT | Status: AC
  Filled 2017-05-26: qty 10

## 2017-05-26 MED ORDER — DIPHENHYDRAMINE HCL 50 MG/ML IJ SOLN: 12.5000 mg | INTRAMUSCULAR | Status: DC | PRN

## 2017-05-26 MED ORDER — ACETAMINOPHEN 325 MG PO TABS
650.0000 mg | ORAL_TABLET | ORAL | Status: DC | PRN
  Filled 2017-05-26: qty 2

## 2017-05-26 MED ORDER — MISOPROSTOL 25 MCG QUARTER TABLET
25.0000 ug | ORAL_TABLET | ORAL | Status: DC | PRN
  Administered 2017-05-26: 25 ug via VAGINAL
  Filled 2017-05-26 (×2): qty 1

## 2017-05-26 MED ORDER — LACTATED RINGERS IV SOLN
INTRAVENOUS | Status: DC
  Administered 2017-05-26 – 2017-05-27 (×4): via INTRAVENOUS

## 2017-05-26 MED ORDER — SODIUM CHLORIDE 0.9 % IJ SOLN
INTRAMUSCULAR | Status: AC
  Filled 2017-05-26: qty 50

## 2017-05-26 MED ORDER — LIDOCAINE HCL (PF) 1 % IJ SOLN
INTRAMUSCULAR | Status: DC | PRN
  Administered 2017-05-26: 1 mL via INTRADERMAL
  Administered 2017-05-26: 3 mL

## 2017-05-26 MED ORDER — LACTATED RINGERS IV SOLN
500.0000 mL | INTRAVENOUS | Status: DC | PRN
  Administered 2017-05-26 (×3): 500 mL via INTRAVENOUS

## 2017-05-26 MED ORDER — TERBUTALINE SULFATE 1 MG/ML IJ SOLN: 0.2500 mg | Freq: Once | INTRAMUSCULAR | Status: DC | PRN

## 2017-05-26 MED ORDER — FENTANYL 2.5 MCG/ML W/ROPIVACAINE 0.15% IN NS 100 ML EPIDURAL (ARMC)
EPIDURAL | Status: AC
  Filled 2017-05-26: qty 100

## 2017-05-26 MED ORDER — MISOPROSTOL 200 MCG PO TABS
ORAL_TABLET | ORAL | Status: AC
  Administered 2017-05-26: 25 ug via ORAL
  Filled 2017-05-26: qty 4

## 2017-05-26 MED ORDER — BUTORPHANOL TARTRATE 1 MG/ML IJ SOLN: 1.0000 mg | INTRAMUSCULAR | Status: DC | PRN

## 2017-05-26 MED ORDER — EPHEDRINE 5 MG/ML INJ
10.0000 mg | INTRAVENOUS | Status: DC | PRN
  Filled 2017-05-26: qty 2

## 2017-05-26 MED ORDER — LIDOCAINE HCL (PF) 1 % IJ SOLN
30.0000 mL | INTRAMUSCULAR | Status: DC | PRN
  Filled 2017-05-26: qty 30

## 2017-05-26 MED ORDER — MISOPROSTOL 25 MCG QUARTER TABLET
25.0000 ug | ORAL_TABLET | Freq: Once | ORAL | Status: AC
  Administered 2017-05-26: 25 ug via ORAL

## 2017-05-26 MED ORDER — LIDOCAINE-EPINEPHRINE (PF) 1.5 %-1:200000 IJ SOLN
INTRAMUSCULAR | Status: DC | PRN
  Administered 2017-05-26: 3 mL via EPIDURAL

## 2017-05-26 MED ORDER — SODIUM CHLORIDE 0.9 % IV SOLN
INTRAVENOUS | Status: DC | PRN
  Administered 2017-05-26 (×3): 5 mL via EPIDURAL

## 2017-05-26 MED ORDER — OXYTOCIN BOLUS FROM INFUSION
500.0000 mL | Freq: Once | INTRAVENOUS | Status: AC
  Administered 2017-05-26: 500 mL via INTRAVENOUS

## 2017-05-26 MED ORDER — FENTANYL 2.5 MCG/ML W/ROPIVACAINE 0.15% IN NS 100 ML EPIDURAL (ARMC)
EPIDURAL | Status: DC | PRN
Start: 2017-05-26 — End: 2017-05-26

## 2017-05-26 MED ORDER — OXYTOCIN 10 UNIT/ML IJ SOLN
INTRAMUSCULAR | Status: AC
  Filled 2017-05-26: qty 2

## 2017-05-26 MED ORDER — BUPIVACAINE HCL (PF) 0.25 % IJ SOLN
INTRAMUSCULAR | Status: DC | PRN
  Administered 2017-05-26 (×2): 4 mL via EPIDURAL

## 2017-05-26 MED ORDER — OXYTOCIN 40 UNITS IN LACTATED RINGERS INFUSION - SIMPLE MED
2.5000 [IU]/h | INTRAVENOUS | Status: DC
  Filled 2017-05-26: qty 1000

## 2017-05-26 MED ORDER — ONDANSETRON HCL 4 MG/2ML IJ SOLN: 4.0000 mg | Freq: Four times a day (QID) | INTRAMUSCULAR | Status: DC | PRN

## 2017-05-26 MED ORDER — LACTATED RINGERS IV SOLN: 500.0000 mL | Freq: Once | INTRAVENOUS | Status: DC

## 2017-05-26 NOTE — Progress Notes (Addendum)
   Subjective:  Comfortable, epidural in place  Objective:   Vitals: Blood pressure 116/68, pulse 75, temperature 97.7 F (36.5 C), temperature source Oral, resp. rate 18, height 5\' 7"  (1.702 m), weight 209 lb (94.8 kg), last menstrual period 08/24/2016, SpO2 98 %. General: NAD Abdomen: gravid, non-tender Cervical Exam:  Dilation: 4 Effacement (%): 70 Station: -2 Exam by:: Eligah Anello, MD  FHT: 130, moderate, +accels, no decels Toco: q3-654min  Results for orders placed or performed during the hospital encounter of 05/26/17 (from the past 24 hour(s))  CBC     Status: None   Collection Time: 05/26/17  6:09 AM  Result Value Ref Range   WBC 9.7 3.6 - 11.0 K/uL   RBC 4.29 3.80 - 5.20 MIL/uL   Hemoglobin 12.8 12.0 - 16.0 g/dL   HCT 16.137.4 09.635.0 - 04.547.0 %   MCV 87.2 80.0 - 100.0 fL   MCH 29.8 26.0 - 34.0 pg   MCHC 34.2 32.0 - 36.0 g/dL   RDW 40.914.1 81.111.5 - 91.414.5 %   Platelets 181 150 - 440 K/uL  Type and screen [facility]     Status: None   Collection Time: 05/26/17  6:09 AM  Result Value Ref Range   ABO/RH(D) A NEG    Antibody Screen NEG    Sample Expiration      05/29/2017 Performed at [facility], [address] [place], [place] [address]     Assessment:   32 y.o. G2P1001 4934w2d elective IOL  Plan:   1) Labor - start pitocin, still ballotable plan on AROM once better applied  2) Fetus - cat I tracing  Vena AustriaAndreas Victoria Euceda, MD, Merlinda FrederickFACOG [facility], [facility] 05/26/2017, 4:26 PM

## 2017-05-26 NOTE — Anesthesia Preprocedure Evaluation (Addendum)
Anesthesia Evaluation  Patient identified by MRN, date of birth, ID band Patient awake    Reviewed: Allergy & Precautions, NPO status , Patient's Chart, lab work & pertinent test results, reviewed documented beta blocker date and time   Airway Mallampati: III  TM Distance: >3 FB     Dental  (+) Chipped   Pulmonary           Cardiovascular      Neuro/Psych    GI/Hepatic   Endo/Other    Renal/GU      Musculoskeletal   Abdominal   Peds  Hematology   Anesthesia Other Findings Obese. PCOS.  Reproductive/Obstetrics                             Anesthesia Physical Anesthesia Plan  ASA: III  Anesthesia Plan: Epidural   Post-op Pain Management:    Induction:   PONV Risk Score and Plan:   Airway Management Planned:   Additional Equipment:   Intra-op Plan:   Post-operative Plan:   Informed Consent: I have reviewed the patients History and Physical, chart, labs and discussed the procedure including the risks, benefits and alternatives for the proposed anesthesia with the patient or authorized representative who has indicated his/her understanding and acceptance.     Plan Discussed with: CRNA  Anesthesia Plan Comments:         Anesthesia Quick Evaluation

## 2017-05-26 NOTE — Progress Notes (Signed)
Subjective:  Comfortable with intermittent contractions after first Cytotec dose.  No LOF,, +FM, no VB Objective:   Vitals: Blood pressure 116/75, pulse 71, temperature 97.6 F (36.4 C), temperature source Oral, resp. rate 16, height 5\' 7"  (1.702 m), weight 209 lb (94.8 kg), last menstrual period 08/24/2016. 12 lb (5.443 kg)  General: NAD Abdomen: soft, non-tender Cervical Exam:  Dilation: 1 Effacement (%): 50 Station: -2 Exam by:: Malachy MoanSam Stephens, RN  FHT: 140, moderate, +accels, no decels Toco: not picking up well  Results for orders placed or performed during the hospital encounter of 05/26/17 (from the past 24 hour(s))  CBC     Status: None   Collection Time: 05/26/17  6:09 AM  Result Value Ref Range   WBC 9.7 3.6 - 11.0 K/uL   RBC 4.29 3.80 - 5.20 MIL/uL   Hemoglobin 12.8 12.0 - 16.0 g/dL   HCT 19.137.4 47.835.0 - 29.547.0 %   MCV 87.2 80.0 - 100.0 fL   MCH 29.8 26.0 - 34.0 pg   MCHC 34.2 32.0 - 36.0 g/dL   RDW 62.114.1 30.811.5 - 65.714.5 %   Platelets 181 150 - 440 K/uL  Type and screen [facility]     Status: None   Collection Time: 05/26/17  6:09 AM  Result Value Ref Range   ABO/RH(D) A NEG    Antibody Screen NEG    Sample Expiration      05/29/2017 Performed at [facility], [address] [place], [place] [address]     Assessment:   32 y.o. G2P1001 6863w2d IOL for history of shoulder dystocia and third degree laceration with current concern for macrosomia  pregnancy Problems (from 10/14/16 to present)    Problem Noted Resolved   Supervision of high risk pregnancy, antepartum 10/14/2016 by Conard NovakJackson, Stephen D, MD No   Overview Addendum 04/09/2017  4:07 PM by Vena AustriaStaebler, Eliav Mechling, MD    [facility]inic [facility] P[facility]atal Labs  Dating Sure LMP = 8 week US Blood type: A/Negative/-- (07/17 1510)   Genetic Screen 1 Screen: declines  Antibody:Negative (07/17 1510)  Anatomic US Normal Female Rubella: 2.71 (07/17 1510) Immune Varicella: Immune  GTT Early: 120  Third  trimester: 111 RPR: Non Reactive (07/17 1510)   Rhogam Declined counseled about future pregnancy risk if FOB Rh positive (FOB Rh negative) HBsAg: Negative (07/17 1510)   TDaP vaccine Declined Flu Shot: Declines HIV: negative  Baby Food                   Breast             GBS: negative  Contraception  Pap: 07/13/14 NIL HPV negative         Obesity affecting pregnancy 10/14/2016 by Conard NovakJackson, Stephen D, MD No   Overview Addendum 10/23/2016 10:26 AM by Conard NovakJackson, Stephen D, MD    [x]  early 1 hour gtt - 120 mg/dL      BMI 29.5-28.4,XLKGM30.0-30.9,adult 10/14/2016 by Conard NovakJackson, Stephen D, MD No       Plan:   1) Labor - Cytotec, plan on cervical foley at the time of next Cytotec administration - We discussed that prior 3rd degree does not necessarily increase her risk of subsequent third or fourth degree based on currently available literature  2) Fetus -  - Total weight gain this pregnancy 12lbs - early 1-hr 120, 28 week 1-hr 111 - Pelvis tested to 8lbs 4oz but with history of shoulder dystocia and third degree laceration - EFW 05/21/16 at  [redacted]w[redacted]d 3917g or 8lbs 10oz c/w 93%ile with AC at [redacted]w[redacted]d c/w 99%ile.  Assuming appropriate interval growth of 200g in a week the current EFW is 4117g or 9lbs 1oz, however given the margin of error in a late third trimester ultrasound of +/-580g the range would be 3537g - 4697g or 7lbs 12oz - 10lbs 6oz. - We discussed that currently ACOG does not recommend induction for presumed macrosomia but that given her obstetric history it is still reasonable to limit further growth prior to delivery. We discussed monitoring labor curve closely if any indication that the baby was not descending appropriately or failure to dilate high suspicion for CPD.  I also discussed that should we get into a situation where there is fetal distress I would not recommend operative vaginal delivery. The maneuvers and possible complications of shoulder dystocia should it be encountered were discussed.   Vena Austria, MD, Evern Core [facility], [facility] 05/26/2017, 9:15 AM

## 2017-05-26 NOTE — Discharge Summary (Signed)
Obstetric Discharge Summary Reason for Admission: induction of labor Prenatal Procedures: none Intrapartum Procedures: spontaneous vaginal delivery Postpartum Procedures: none Complications-Operative and Postpartum: none Hemoglobin  Date Value Ref Range Status  05/27/2017 12.1 12.0 - 16.0 g/dL Final  16/10/960412/02/2017 54.011.5 11.1 - 15.9 g/dL Final   HCT  Date Value Ref Range Status  05/27/2017 36.0 35.0 - 47.0 % Final   Hematocrit  Date Value Ref Range Status  03/11/2017 36.5 34.0 - 46.6 % Final    Physical Exam:  General: alert, cooperative and appears stated age Lochia: appropriate Uterine Fundus: firm Incision: NA DVT Evaluation: No evidence of DVT seen on physical exam.  Prenatal labs: Mom A negative (baby also A negative) Rhogam not indicated Rubella Immune, Varicella Immune Flu and TDAP vaccines declined  Discharge Diagnoses: Term Pregnancy-delivered  Discharge Information: Date: 05/28/2017 Activity: pelvic rest Diet: routine  Contraception: Progesterone-only pill  Allergies as of 05/28/2017      Reactions   Azithromycin Hives      Medication List    TAKE these medications   metFORMIN 500 MG 24 hr tablet Commonly known as:  GLUCOPHAGE-XR TAKE 1 TABLET BY MOUTH DAILY WITH EVENING MEAL   multivitamin-prenatal 27-0.8 MG Tabs tablet Take 1 tablet by mouth daily at 12 noon.   norethindrone 0.35 MG tablet Commonly known as:  MICRONOR,CAMILA,ERRIN Take 1 tablet (0.35 mg total) by mouth daily. Start taking on:  06/07/2017       Condition: stable Discharge to: home Follow-up Information    Vena AustriaStaebler, Andreas, MD Follow up in 6 week(s).   Specialty:  Obstetrics and Gynecology Contact information: [address] [place] [address] [phone]           Newborn Data: Live born female [place] Birth Weight: 8 pounds 12.7 ounces  APGAR: 8, 9 Newborn feeding: Breast  Newborn Delivery   Birth date/time:  05/26/2017 20:56:00 Delivery type:   Vaginal, Spontaneous     Home with mother.  Tresea MallJane Tanashia Ciesla, CNM 05/28/2017, 9:49 AM

## 2017-05-26 NOTE — Anesthesia Procedure Notes (Signed)
Epidural Patient location during procedure: OB Start time: 05/26/2017 3:20 PM End time: 05/26/2017 3:22 PM  Staffing Anesthesiologist: Berdine Addisonhomas, Mathai, MD Resident/CRNA: Stormy Fabianurtis, Christyn Gutkowski, CRNA Performed: resident/CRNA   Preanesthetic Checklist Completed: patient identified, site marked, surgical consent, pre-op evaluation, timeout performed, IV checked, risks and benefits discussed and monitors and equipment checked  Epidural Patient position: sitting Prep: Betadine Patient monitoring: heart rate, continuous pulse ox and blood pressure Approach: midline Location: L4-L5 Injection technique: LOR saline  Needle:  Needle type: Tuohy  Needle gauge: 17 G Needle length: 9 cm and 9 Needle insertion depth: 6.5 cm Catheter type: closed end flexible Catheter size: 19 Gauge Catheter at skin depth: 11 cm Test dose: negative and 1.5% lidocaine with Epi 1:200 K  Assessment Sensory level: T10 Events: blood not aspirated, injection not painful, no injection resistance, negative IV test and no paresthesia  Additional Notes Pt. Evaluated and documentation done after procedure finished. Patient identified. Risks/Benefits/Options discussed with patient including but not limited to bleeding, infection, nerve damage, paralysis, failed block, incomplete pain control, headache, blood pressure changes, nausea, vomiting, reactions to medication both or allergic, itching and postpartum back pain. Confirmed with bedside nurse the patient's most recent platelet count. Confirmed with patient that they are not currently taking any anticoagulation, have any bleeding history or any family history of bleeding disorders. Patient expressed understanding and wished to proceed. All questions were answered. Sterile technique was used throughout the entire procedure. Please see nursing notes for vital signs. Test dose was given through epidural catheter and negative prior to continuing to dose epidural or start infusion.  Warning signs of high block given to the patient including shortness of breath, tingling/numbness in hands, complete motor block, or any concerning symptoms with instructions to call for help. Patient was given instructions on fall risk and not to get out of bed. All questions and concerns addressed with instructions to call with any issues or inadequate analgesia.   Patient tolerated the insertion well without immediate complications.Reason for block:procedure for pain

## 2017-05-26 NOTE — H&P (Signed)
OB History & Physical   History of Present Illness:  Chief Complaint: Induction of labor for history of shoulder dystocia and 3rd degree laceration  HPI:  Kathy Williamson is a 32 y.o. G108P1001 female at [redacted]w[redacted]d dated by LMP.  Her pregnancy has been complicated by obesity, pcos, Rh negative.    She reports contractions.   She denies leakage of fluid.   She denies vaginal bleeding.   She reports fetal movement.    Maternal Medical History:   Past Medical History:  Diagnosis Date  . H/O shoulder dystocia in prior pregnancy, currently pregnant    G1  . Infertility management   . PCOS (polycystic ovarian syndrome)   . Pre-diabetes   . Pruritic urticarial papules and plaques of pregnancy    with G1  . Third degree perineal laceration    G1    Past Surgical History:  Procedure Laterality Date  . COLPOSCOPY N/A 2012  . KNEE SURGERY Right 2013  . SHOULDER SURGERY Left 2009  . TONSILLECTOMY AND ADENOIDECTOMY      Allergies  Allergen Reactions  . Azithromycin Hives    Prior to Admission medications   Medication Sig Start Date End Date Taking? Authorizing Provider  metFORMIN (GLUCOPHAGE-XR) 500 MG 24 hr tablet TAKE 1 TABLET BY MOUTH DAILY WITH EVENING MEAL 04/06/17  Yes Copland, Alicia B, PA-C  Prenatal Vit-Fe Fumarate-FA (MULTIVITAMIN-PRENATAL) 27-0.8 MG TABS tablet Take 1 tablet by mouth daily at 12 noon.   Yes [provider]    OB History  Gravida Para Term Preterm AB Living  2 1 1     1   SAB TAB Ectopic Multiple Live Births          1    # Outcome Date GA Lbr Len/2nd Weight Sex Delivery Anes PTL Lv  2 Current           1 Term 03/02/12 [redacted]w[redacted]d  8 lb 4 oz (3.742 kg) F Vag-Spont EPI N LIV      Prenatal care site: [facility]  Social History: She  reports that  has never smoked. she has never used smokeless tobacco. She reports that she does not drink alcohol or use drugs.  Family History: family history includes Arthritis in her maternal grandmother; Breast cancer  in her other; Cardiomyopathy in her paternal grandfather and paternal grandmother; Diabetes in her maternal grandmother and mother; Heart disease in her maternal grandfather; Hypertension in her maternal grandmother; Kidney failure in her maternal grandfather and maternal grandmother; Stroke in her maternal grandmother.    Review of Systems: Negative x 10 systems reviewed except as noted in the HPI.    Physical Exam:  Vital Signs: BP 117/66 (BP Location: Left Arm)   Pulse 84   Temp 97.7 F (36.5 C) (Oral)   Resp 16   Ht 5\' 7"  (1.702 m)   Wt 209 lb (94.8 kg)   LMP 08/24/2016   BMI 32.73 kg/m  Constitutional: Well nourished, well developed female in no acute distress.  HEENT: normal Skin: Warm and dry.  Cardiovascular: Regular rate and rhythm.   Extremity: no edema  Respiratory: Clear to auscultation bilateral. Normal respiratory effort Abdomen: FHT present Back: no CVAT Neuro: DTRs 2+, Cranial nerves grossly intact Psych: Alert and Oriented x3. No memory deficits. Normal mood and affect.  MS: normal gait, normal bilateral lower extremity ROM/strength/stability.  Pelvic exam:  is not limited by body habitus EGBUS: within normal limits Vagina: within normal limits and with normal mucosa  Cervix: 0.5  cm   Pertinent Results:  Prenatal Labs: Blood type/Rh A negative  Antibody screen negative  Rubella Immune  Varicella Immune    RPR Non-reactive  HBsAg negative  HIV negative  GC negative  Chlamydia negative  Genetic screening declined  1 hour GTT 111 on 03/11/17  3 hour GTT NA  GBS negative on 05/04/17   Baseline FHR: 140 beats/min   Variability: moderate   Accelerations: present   Decelerations: present Contractions: present frequency: every 4-8 minutes Overall assessment: Category I   Assessment:  Kathy Williamson is a 32 y.o. 912P1001 female at 3544w2d with induction of labor for history of shoulder dystocia/3rd degree.   Plan:  1. Admit to Labor & Delivery  2. CBC,  T&S, Clrs, IVF 3. GBS negative.   4. Fetal well-being: Category I 5. Begin induction with cytotec  Tresea MallJane Ra Pfiester, CNM 05/26/2017 6:21 AM

## 2017-05-26 NOTE — Anesthesia Procedure Notes (Signed)
Epidural Patient location during procedure: OB Start time: 05/26/2017 8:08 PM End time: 05/26/2017 8:13 PM  Staffing Anesthesiologist: Wendolyn Raso, Cleda MccreedyJoseph K, MD Performed: anesthesiologist   Preanesthetic Checklist Completed: patient identified, site marked, surgical consent, pre-op evaluation, timeout performed, IV checked, risks and benefits discussed and monitors and equipment checked  Epidural Patient position: sitting Prep: ChloraPrep Patient monitoring: heart rate, continuous pulse ox and blood pressure Approach: midline Location: L3-L4 Injection technique: LOR saline  Needle:  Needle type: Tuohy  Needle gauge: 17 G Needle length: 9 cm and 9 Needle insertion depth: 6 cm Catheter type: closed end flexible Catheter size: 19 Gauge Catheter at skin depth: 11 cm Test dose: negative and 1.5% lidocaine with Epi 1:200 K  Assessment Sensory level: T10 Events: blood not aspirated, injection not painful, no injection resistance, negative IV test and no paresthesia  Additional Notes Replacement epidural. 1 attempt. Pt. Evaluated and documentation done after procedure finished. Patient identified. Risks/Benefits/Options discussed with patient including but not limited to bleeding, infection, nerve damage, paralysis, failed block, incomplete pain control, headache, blood pressure changes, nausea, vomiting, reactions to medication both or allergic, itching and postpartum back pain. Confirmed with bedside nurse the patient's most recent platelet count. Confirmed with patient that they are not currently taking any anticoagulation, have any bleeding history or any family history of bleeding disorders. Patient expressed understanding and wished to proceed. All questions were answered. Sterile technique was used throughout the entire procedure. Please see nursing notes for vital signs. Test dose was given through epidural catheter and negative prior to continuing to dose epidural or start  infusion. Warning signs of high block given to the patient including shortness of breath, tingling/numbness in hands, complete motor block, or any concerning symptoms with instructions to call for help. Patient was given instructions on fall risk and not to get out of bed. All questions and concerns addressed with instructions to call with any issues or inadequate analgesia.   Patient tolerated the insertion well without immediate complications.Reason for block:procedure for pain

## 2017-05-26 NOTE — Progress Notes (Signed)
   Subjective:  Comfortable with epidural in place some more pressure  Objective:   Vitals: Blood pressure 106/64, pulse 68, temperature 97.7 F (36.5 C), temperature source Oral, resp. rate 18, height 5\' 7"  (1.702 m), weight 209 lb (94.8 kg), last menstrual period 08/24/2016, SpO2 98 %. General:  Abdomen: Cervical Exam:  Dilation: 4 Effacement (%): 80 Station: -2 Exam by:: Mari Battaglia, md  FHT: 135, moderate, +accels, no decels Toco: q183min  Results for orders placed or performed during the hospital encounter of 05/26/17 (from the past 24 hour(s))  CBC     Status: None   Collection Time: 05/26/17  6:09 AM  Result Value Ref Range   WBC 9.7 3.6 - 11.0 K/uL   RBC 4.29 3.80 - 5.20 MIL/uL   Hemoglobin 12.8 12.0 - 16.0 g/dL   HCT 16.137.4 09.635.0 - 04.547.0 %   MCV 87.2 80.0 - 100.0 fL   MCH 29.8 26.0 - 34.0 pg   MCHC 34.2 32.0 - 36.0 g/dL   RDW 40.914.1 81.111.5 - 91.414.5 %   Platelets 181 150 - 440 K/uL  Type and screen [facility]     Status: None   Collection Time: 05/26/17  6:09 AM  Result Value Ref Range   ABO/RH(D) A NEG    Antibody Screen NEG    Sample Expiration      05/29/2017 Performed at [facility], [address] [place], [place] [address]     Assessment:   32 y.o. G2P1001 3328w2d IOL elective (macrosomia, history of shoulder dystocia, and 3rd degree laceration)  Plan:   1) Labor - AROM clear, continue pitocin  2) Fetus - cat I tracing  Vena AustriaAndreas Azaleah Usman, MD, Merlinda FrederickFACOG [facility], [facility] 05/26/2017, 6:27 PM

## 2017-05-27 ENCOUNTER — Other Ambulatory Visit: Payer: BLUE CROSS/BLUE SHIELD

## 2017-05-27 ENCOUNTER — Encounter: Payer: BLUE CROSS/BLUE SHIELD | Admitting: Obstetrics & Gynecology

## 2017-05-27 LAB — CBC
HEMATOCRIT: 36 % (ref 35.0–47.0)
Hemoglobin: 12.1 g/dL (ref 12.0–16.0)
MCH: 29.4 pg (ref 26.0–34.0)
MCHC: 33.5 g/dL (ref 32.0–36.0)
MCV: 87.8 fL (ref 80.0–100.0)
Platelets: 163 10*3/uL (ref 150–440)
RBC: 4.1 MIL/uL (ref 3.80–5.20)
RDW: 14.1 % (ref 11.5–14.5)
WBC: 10.8 10*3/uL (ref 3.6–11.0)

## 2017-05-27 LAB — RPR: RPR: NONREACTIVE

## 2017-05-27 MED ORDER — SENNOSIDES-DOCUSATE SODIUM 8.6-50 MG PO TABS
2.0000 | ORAL_TABLET | ORAL | Status: DC
  Administered 2017-05-27 – 2017-05-28 (×2): 2 via ORAL
  Filled 2017-05-27 (×3): qty 2

## 2017-05-27 MED ORDER — WITCH HAZEL-GLYCERIN EX PADS: 1.0000 "application " | MEDICATED_PAD | CUTANEOUS | Status: DC | PRN

## 2017-05-27 MED ORDER — ACETAMINOPHEN 325 MG PO TABS
650.0000 mg | ORAL_TABLET | ORAL | Status: DC | PRN
  Administered 2017-05-27: 650 mg via ORAL

## 2017-05-27 MED ORDER — BENZOCAINE-MENTHOL 20-0.5 % EX AERO: 1.0000 "application " | INHALATION_SPRAY | CUTANEOUS | Status: DC | PRN

## 2017-05-27 MED ORDER — ONDANSETRON HCL 4 MG PO TABS: 4.0000 mg | ORAL_TABLET | ORAL | Status: DC | PRN

## 2017-05-27 MED ORDER — OXYCODONE-ACETAMINOPHEN 5-325 MG PO TABS
2.0000 | ORAL_TABLET | ORAL | Status: DC | PRN
  Administered 2017-05-27: 2 via ORAL
  Filled 2017-05-27: qty 2

## 2017-05-27 MED ORDER — DIPHENHYDRAMINE HCL 25 MG PO CAPS: 25.0000 mg | ORAL_CAPSULE | Freq: Four times a day (QID) | ORAL | Status: DC | PRN

## 2017-05-27 MED ORDER — DIBUCAINE 1 % RE OINT: 1.0000 "application " | TOPICAL_OINTMENT | RECTAL | Status: DC | PRN

## 2017-05-27 MED ORDER — COCONUT OIL OIL: 1.0000 "application " | TOPICAL_OIL | Status: DC | PRN

## 2017-05-27 MED ORDER — PRENATAL MULTIVITAMIN CH
1.0000 | ORAL_TABLET | Freq: Every day | ORAL | Status: DC
  Administered 2017-05-27: 1 via ORAL
  Filled 2017-05-27: qty 1

## 2017-05-27 MED ORDER — ONDANSETRON HCL 4 MG/2ML IJ SOLN: 4.0000 mg | INTRAMUSCULAR | Status: DC | PRN

## 2017-05-27 MED ORDER — SIMETHICONE 80 MG PO CHEW: 80.0000 mg | CHEWABLE_TABLET | ORAL | Status: DC | PRN

## 2017-05-27 MED ORDER — OXYCODONE-ACETAMINOPHEN 5-325 MG PO TABS: 1.0000 | ORAL_TABLET | ORAL | Status: DC | PRN

## 2017-05-27 NOTE — Anesthesia Postprocedure Evaluation (Signed)
Anesthesia Post Note  Patient: Kathy Williamson  Procedure(s) Performed: AN AD HOC LABOR EPIDURAL  Patient location during evaluation: Mother Baby Anesthesia Type: Epidural Level of consciousness: awake and alert and oriented Pain management: satisfactory to patient Vital Signs Assessment: post-procedure vital signs reviewed and stable Respiratory status: respiratory function stable Cardiovascular status: stable Postop Assessment: no backache, no headache, epidural receding, patient able to bend at knees, no apparent nausea or vomiting and adequate PO intake Anesthetic complications: no     Last Vitals:  Vitals:   05/27/17 0240 05/27/17 0608  BP: (!) 104/53 98/65  Pulse: 74 72  Resp: 16 16  Temp: 37.1 C 36.6 C  SpO2: 97% 98%    Last Pain:  Vitals:   05/27/17 [id number]  TempSrc: Oral  PainSc:                  Clydene PughBeane, Wesleigh Markovic D

## 2017-05-27 NOTE — Progress Notes (Signed)
Post Partum Day 1 Subjective: up ad lib, voiding and tolerating PO; tired  Objective: Blood pressure 108/66, pulse 65, temperature 98 F (36.7 C), resp. rate 18, height 5\' 7"  (1.702 m), weight 94.8 kg (209 lb), last menstrual period 08/24/2016, SpO2 97 %.  Physical Exam:  General: alert, cooperative and no distress Lochia: appropriate Uterine Fundus: firm/ U to U-1/NT/ML   DVT Evaluation: No evidence of DVT seen on physical exam.  Recent Labs    05/26/17 0609 05/27/17 0610  HGB 12.8 12.1  HCT 37.4 36.0  WBC 9.7 10.8  PLT 181 163   Information for the patient's newborn:  Revolorio, Girl Delice Bisonara [[id number]][[id number]]  A NEG   Assessment/Plan: Stable PPD #1 Does not need Rhogam (both mom and baby A neg)/ DC IV RI/VI Breast Last TDAP? Contraception?  Plan discharge for tomorrow  Farrel Connersolleen Celestine Prim, CNM    LOS: 1 day   Farrel ConnersColleen Ravan Schlemmer 05/27/2017, 10:00 AM

## 2017-05-28 MED ORDER — NORETHINDRONE 0.35 MG PO TABS: 1.0000 | ORAL_TABLET | Freq: Every day | ORAL | 11 refills | Status: DC

## 2017-06-12 ENCOUNTER — Telehealth: Payer: Self-pay

## 2017-06-12 NOTE — Telephone Encounter (Signed)
Pt aware.

## 2017-06-12 NOTE — Telephone Encounter (Signed)
Pt should wait till 6 wk PP appt before restarting hormones and f/u with provider at that appt re: Rx. No need to start sooner.

## 2017-06-12 NOTE — Telephone Encounter (Signed)
Pt calling triage today requesting to speak with ABC. She is 2 week post partum and states that ABC was seeing her for progesterone therapy before she got pregnant and pt wants to talk about starting that back up. Wants to know if she needs an appt

## 2017-06-17 ENCOUNTER — Ambulatory Visit (INDEPENDENT_AMBULATORY_CARE_PROVIDER_SITE_OTHER): Payer: BLUE CROSS/BLUE SHIELD | Admitting: Obstetrics and Gynecology

## 2017-06-17 ENCOUNTER — Encounter: Payer: Self-pay | Admitting: Obstetrics and Gynecology

## 2017-06-17 MED ORDER — PROGESTERONE MICRONIZED 100 MG PO CAPS: 100.0000 mg | ORAL_CAPSULE | Freq: Every day | ORAL | 3 refills | Status: DC

## 2017-06-17 NOTE — Progress Notes (Signed)
Postoperative Follow-up Patient presents from uncomplicated vaginal delivery 3 weeks ago.    Subjective: Patient reports doing well postpartum.  However, infant underwent surgery for a umbilical cyst that was attached to bladder.  She has not been sleeping well and has felt more anxious.  She states she was previously diagnosed with low progesterone and has been managed with Prometrium.  This diagnosis was by an outside provider and done via salivary testing.   Objective: Blood pressure 114/66, pulse 77, weight 193 lb (87.5 kg), currently breastfeeding.  Admission on 05/26/2017, Discharged on 05/28/2017  Component Date Value Ref Range Status  . WBC 05/26/2017 9.7  3.6 - 11.0 K/uL Final  . RBC 05/26/2017 4.29  3.80 - 5.20 MIL/uL Final  . Hemoglobin 05/26/2017 12.8  12.0 - 16.0 g/dL Final  . HCT 16/10/960402/26/2019 37.4  35.0 - 47.0 % Final  . MCV 05/26/2017 87.2  80.0 - 100.0 fL Final  . MCH 05/26/2017 29.8  26.0 - 34.0 pg Final  . MCHC 05/26/2017 34.2  32.0 - 36.0 g/dL Final  . RDW 54/09/811902/26/2019 14.1  11.5 - 14.5 % Final  . Platelets 05/26/2017 181  150 - 440 K/uL Final   Performed at [facility], [address] [place], [place] [address]  . ABO/RH(D) 05/26/2017 A NEG   Final  . Antibody Screen 05/26/2017 NEG   Final  . Sample Expiration 05/26/2017    Final                   Value:05/29/2017 Performed at [facility], [address] [place], [place] [address]   . RPR Ser Ql 05/26/2017 Non Reactive  Non Reactive Final   Comment: (NOTE) Performed At: [facility] [address] [place], [place] [id number] Jolene SchimkeNagendra Sanjai MD NG:[phone] Performed at [facility], [address] [place], [place] [address]   . GBS 05/04/2017 Negative   Final  . WBC 05/27/2017 10.8  3.6 - 11.0 K/uL Final  . RBC 05/27/2017 4.10  3.80 - 5.20 MIL/uL Final  . Hemoglobin 05/27/2017 12.1  12.0 - 16.0 g/dL Final  . HCT 72/53/664402/27/2019 36.0  35.0 - 47.0 % Final  . MCV  05/27/2017 87.8  80.0 - 100.0 fL Final  . MCH 05/27/2017 29.4  26.0 - 34.0 pg Final  . MCHC 05/27/2017 33.5  32.0 - 36.0 g/dL Final  . RDW 03/47/425902/27/2019 14.1  11.5 - 14.5 % Final  . Platelets 05/27/2017 163  150 - 440 K/uL Final   Performed at [facility], [address] [place], [place] [address]    Assessment: 32 y.o. s/p vaginal delivery  Plan: Patient has done well after surgery with no apparent complications.  I have discussed the post-operative course to date, and the expected progress moving forward.  The patient understands what complications to be concerned about.  I will see the patient in routine follow up, or sooner if needed.    Activity plan: Pelvic rest  No clear data to support salivary testing, and no data that Prometrium is beneficial as sleep aid.  This would represent an off label use.  The existence and clinical significance of luteal phase defects is contested.  We discussed this and I'm willing to rx as a continuation of previously started therapy but with the understanding that I have no literature to support the use.  If continued sleep or mood disturbance I recommended consideration of starting an SSRI.   Vena AustriaAndreas Brice Kossman, MD, Evern CoreFACOG  [facility], [facility] 06/18/2017, 9:17 PM

## 2017-07-13 ENCOUNTER — Other Ambulatory Visit: Payer: Self-pay | Admitting: Obstetrics and Gynecology

## 2017-07-13 NOTE — Telephone Encounter (Signed)
Please advise for refill. Thank you.  

## 2017-08-07 ENCOUNTER — Ambulatory Visit (INDEPENDENT_AMBULATORY_CARE_PROVIDER_SITE_OTHER): Payer: BLUE CROSS/BLUE SHIELD | Admitting: Obstetrics and Gynecology

## 2017-08-07 ENCOUNTER — Encounter: Payer: Self-pay | Admitting: Obstetrics and Gynecology

## 2017-08-07 ENCOUNTER — Ambulatory Visit: Payer: BLUE CROSS/BLUE SHIELD | Admitting: Obstetrics and Gynecology

## 2017-08-07 VITALS — BP 128/70 | HR 66 | Wt 195.0 lb

## 2017-08-07 DIAGNOSIS — Z124 Encounter for screening for malignant neoplasm of cervix: Secondary | ICD-10-CM

## 2017-08-07 LAB — HM PAP SMEAR: HM Pap smear: NORMAL

## 2017-08-07 MED ORDER — ESCITALOPRAM OXALATE 10 MG PO TABS: 10.0000 mg | ORAL_TABLET | Freq: Every day | ORAL | 3 refills | Status: DC

## 2017-08-07 MED ORDER — METFORMIN HCL ER 500 MG PO TB24: 500.0000 mg | ORAL_TABLET | Freq: Every day | ORAL | 3 refills | Status: DC

## 2017-08-07 NOTE — Progress Notes (Signed)
Postpartum Visit  Chief Complaint:  Chief Complaint  Patient presents with  . Postpartum Care    History of Present Illness: Patient is a 32 y.o. G2P1001 presents for postpartum visit.  Date of delivery: 02/2602019 Type of delivery: Vaginal delivery - Vacuum or forceps assisted  no Episiotomy No.  Laceration: no  Pregnancy or labor problems:  no Any problems since the delivery:  no  Newborn Details:  SINGLETON :  1. BabyGender female. Birth weight:   Maternal Details:  Breast or formula feeding: plans to breastfeed Contraception after delivery: Yes  Any bowel or bladder issues: No  Post partum depression/anxiety noted: yes Edinburgh Post-Partum Depression Score:14  Review of Systems: Review of Systems  Constitutional: Negative.   Gastrointestinal: Negative.   Psychiatric/Behavioral: Positive for depression. Negative for hallucinations, memory loss, substance abuse and suicidal ideas. The patient is nervous/anxious and has insomnia.     The following portions of the patient's history were reviewed and updated as appropriate: allergies, current medications, past family history, past medical history, past social history, past surgical history and problem list.  Past Medical History:  Past Medical History:  Diagnosis Date  . H/O shoulder dystocia in prior pregnancy, currently pregnant    G1  . Infertility management   . PCOS (polycystic ovarian syndrome)   . Pre-diabetes   . Pruritic urticarial papules and plaques of pregnancy    with G1  . Third degree perineal laceration    G1    Past Surgical History:  Past Surgical History:  Procedure Laterality Date  . COLPOSCOPY N/A 2012  . KNEE SURGERY Right 2013  . SHOULDER SURGERY Left 2009  . TONSILLECTOMY AND ADENOIDECTOMY      Family History:  Family History  Problem Relation Age of Onset  . Diabetes Mother   . Diabetes Maternal Grandmother   . Arthritis Maternal Grandmother   . Stroke Maternal  Grandmother   . Kidney failure Maternal Grandmother   . Hypertension Maternal Grandmother   . Heart disease Maternal Grandfather   . Kidney failure Maternal Grandfather   . Cardiomyopathy Paternal Grandmother   . Cardiomyopathy Paternal Grandfather   . Breast cancer Other     Social History:  Social History   Socioeconomic History  . Marital status: Married    Spouse name: Not on file  . Number of children: Not on file  . Years of education: Not on file  . Highest education level: Not on file  Occupational History  . Not on file  Social Needs  . Financial resource strain: Not on file  . Food insecurity:    Worry: Not on file    Inability: Not on file  . Transportation needs:    Medical: Not on file    Non-medical: Not on file  Tobacco Use  . Smoking status: Never Smoker  . Smokeless tobacco: Never Used  Substance and Sexual Activity  . Alcohol use: No    Alcohol/week: 0.0 oz  . Drug use: No  . Sexual activity: Yes    Birth control/protection: None  Lifestyle  . Physical activity:    Days per week: Not on file    Minutes per session: Not on file  . Stress: Not on file  Relationships  . Social connections:    Talks on phone: Not on file    Gets together: Not on file    Attends religious service: Not on file    Active member of club or organization: Not on file  Attends meetings of clubs or organizations: Not on file    Relationship status: Not on file  . Intimate partner violence:    Fear of current or ex partner: Not on file    Emotionally abused: Not on file    Physically abused: Not on file    Forced sexual activity: Not on file  Other Topics Concern  . Not on file  Social History Narrative  . Not on file    Allergies:  Allergies  Allergen Reactions  . Azithromycin Hives    Medications: Prior to Admission medications   Medication Sig Start Date End Date Taking? Authorizing Provider  metFORMIN (GLUCOPHAGE-XR) 500 MG 24 hr tablet TAKE 1 TABLET  BY MOUTH DAILY WITH EVENING MEAL 07/14/17  Yes Copland, Alicia B, PA-C  Prenatal Vit-Fe Fumarate-FA (MULTIVITAMIN-PRENATAL) 27-0.8 MG TABS tablet Take 1 tablet by mouth daily at 12 noon.   Yes [provider]  progesterone (PROMETRIUM) 100 MG capsule Take 1 capsule (100 mg total) by mouth daily. Place one capsule vaginally at bedtime 06/17/17  Yes Vena Austria, MD  norethindrone (MICRONOR,CAMILA,ERRIN) 0.35 MG tablet Take 1 tablet (0.35 mg total) by mouth daily. Patient not taking: Reported on 06/17/2017 06/07/17   Tresea Mall, CNM    Physical Exam Blood pressure 128/70, pulse 66, weight 195 lb (88.5 kg), currently breastfeeding.    General: NAD HEENT: normocephalic, anicteric Pulmonary: No increased work of breathing Abdomen: NABS, soft, non-tender, non-distended.  Umbilicus without lesions.  No hepatomegaly, splenomegaly or masses palpable. No evidence of hernia. Genitourinary:  External: Normal external female genitalia.  Normal urethral meatus, normal  Bartholin's and Skene's glands.    Vagina: Normal vaginal mucosa, no evidence of prolapse.    Cervix: Grossly normal in appearance, no bleeding  Uterus: Non-enlarged, mobile, normal contour.  No CMT  Adnexa: ovaries non-enlarged, no adnexal masses  Rectal: deferred Extremities: no edema, erythema, or tenderness Neurologic: Grossly intact Psychiatric: mood appropriate, affect full    Assessment: 32 y.o. G2P1001 presenting for 6 week postpartum visit  Plan: Problem List Items Addressed This Visit    None    Visit Diagnoses    Cervical cancer screening    -  Primary   Relevant Orders   PapIG, HPV, rfx 16/18   6 weeks postpartum follow-up           1) Contraception - Education given regarding options for contraception, as well as compatibility with breast feeding if applicable.  Patient plans on oral progesterone-only contraceptive for contraception.  2)  Pap - ASCCP guidelines and rational discussed.  ASCCP  guidelines and rational discussed.  Patient opts for every 3 years screening interval  3) Patient underwent screening for postpartum depression EPDS-14 start lexapro, item 10 is 0  4) Return in about 1 month (around 09/04/2017) for medication follow up.   Vena Austria, MD, Evern Core [facility], [facility] 08/07/2017, 3:24 PM

## 2017-08-11 ENCOUNTER — Encounter (INDEPENDENT_AMBULATORY_CARE_PROVIDER_SITE_OTHER): Payer: Self-pay

## 2017-08-11 LAB — PAPIG, HPV, RFX 16/18
HPV, high-risk: NEGATIVE
PAP Smear Comment: 0

## 2017-08-30 ENCOUNTER — Other Ambulatory Visit: Payer: Self-pay | Admitting: Obstetrics and Gynecology

## 2017-09-08 ENCOUNTER — Encounter: Payer: Self-pay | Admitting: Obstetrics and Gynecology

## 2017-09-08 ENCOUNTER — Ambulatory Visit (INDEPENDENT_AMBULATORY_CARE_PROVIDER_SITE_OTHER): Payer: BLUE CROSS/BLUE SHIELD | Admitting: Obstetrics and Gynecology

## 2017-09-08 VITALS — BP 116/70 | HR 62 | Ht 67.0 in | Wt 196.0 lb

## 2017-09-08 DIAGNOSIS — F53 Postpartum depression: Secondary | ICD-10-CM

## 2017-09-08 DIAGNOSIS — O99345 Other mental disorders complicating the puerperium: Secondary | ICD-10-CM | POA: Diagnosis not present

## 2017-09-08 MED ORDER — ESCITALOPRAM OXALATE 20 MG PO TABS: 20.0000 mg | ORAL_TABLET | Freq: Every day | ORAL | 2 refills | Status: DC

## 2017-09-08 NOTE — Progress Notes (Signed)
Obstetrics & Gynecology Office Visit   Chief Complaint:  Chief Complaint  Patient presents with  . Follow-up    Medication follow up    History of Present Illness: The patient is a 32 y.o. female presenting follow up for symptoms of anxiety and depression.  The patient is currently taking lexapro 10mg  for the management of her symptoms.  She has had any recent situational stressors, child birth.  She reports symptoms of anhedonia, insomnia, irritability and social anxiety.  She denies risk taking behavior, irritability, agorophobia, feelings of guilt, feelings of worthlessness, suicidal ideation, homicidal ideation, auditory hallucinations and visual hallucinations. Symptoms have improved since last visit.     Some improvement in overall symptoms but remains bothersome to her at present level.  Has otherwise done well with starting lexparo  Review of Systems: Review of Systems  Constitutional: Negative.   Gastrointestinal: Negative for nausea.  Neurological: Negative for headaches.  Psychiatric/Behavioral: Positive for depression. Negative for hallucinations, memory loss, substance abuse and suicidal ideas. The patient is nervous/anxious and has insomnia.      Past Medical History:  Past Medical History:  Diagnosis Date  . H/O shoulder dystocia in prior pregnancy, currently pregnant    G1  . Infertility management   . PCOS (polycystic ovarian syndrome)   . Pre-diabetes   . Pruritic urticarial papules and plaques of pregnancy    with G1  . Third degree perineal laceration    G1    Past Surgical History:  Past Surgical History:  Procedure Laterality Date  . COLPOSCOPY N/A 2012  . KNEE SURGERY Right 2013  . SHOULDER SURGERY Left 2009  . TONSILLECTOMY AND ADENOIDECTOMY      Gynecologic History: No LMP recorded.  Obstetric History: G2P1001  Family History:  Family History  Problem Relation Age of Onset  . Diabetes Mother   . Diabetes Maternal Grandmother   .  Arthritis Maternal Grandmother   . Stroke Maternal Grandmother   . Kidney failure Maternal Grandmother   . Hypertension Maternal Grandmother   . Heart disease Maternal Grandfather   . Kidney failure Maternal Grandfather   . Cardiomyopathy Paternal Grandmother   . Cardiomyopathy Paternal Grandfather   . Breast cancer Other     Social History:  Social History   Socioeconomic History  . Marital status: Married    Spouse name: Not on file  . Number of children: Not on file  . Years of education: Not on file  . Highest education level: Not on file  Occupational History  . Not on file  Social Needs  . Financial resource strain: Not on file  . Food insecurity:    Worry: Not on file    Inability: Not on file  . Transportation needs:    Medical: Not on file    Non-medical: Not on file  Tobacco Use  . Smoking status: Never Smoker  . Smokeless tobacco: Never Used  Substance and Sexual Activity  . Alcohol use: No    Alcohol/week: 0.0 oz  . Drug use: No  . Sexual activity: Yes    Birth control/protection: None  Lifestyle  . Physical activity:    Days per week: Not on file    Minutes per session: Not on file  . Stress: Not on file  Relationships  . Social connections:    Talks on phone: Not on file    Gets together: Not on file    Attends religious service: Not on file    Active  member of club or organization: Not on file    Attends meetings of clubs or organizations: Not on file    Relationship status: Not on file  . Intimate partner violence:    Fear of current or ex partner: Not on file    Emotionally abused: Not on file    Physically abused: Not on file    Forced sexual activity: Not on file  Other Topics Concern  . Not on file  Social History Narrative  . Not on file    Allergies:  Allergies  Allergen Reactions  . Azithromycin Hives    Medications: Prior to Admission medications   Medication Sig Start Date End Date Taking? Authorizing Provider    escitalopram (LEXAPRO) 20 MG tablet Take 1 tablet (20 mg total) by mouth daily. 09/08/17  Yes Vena Austria, MD  metFORMIN (GLUCOPHAGE-XR) 500 MG 24 hr tablet Take 1 tablet (500 mg total) by mouth daily with breakfast. 08/07/17  Yes Vena Austria, MD  Prenatal Vit-Fe Fumarate-FA (MULTIVITAMIN-PRENATAL) 27-0.8 MG TABS tablet Take 1 tablet by mouth daily at 12 noon.   Yes [provider]  progesterone (PROMETRIUM) 100 MG capsule Take 1 capsule (100 mg total) by mouth daily. Place one capsule vaginally at bedtime 06/17/17  Yes Vena Austria, MD    Physical Exam Vitals:  Vitals:   09/08/17 1526  BP: 116/70  Pulse: 62   No LMP recorded.  General: NAD HEENT: normocephalic, anicteric Pulmonary: No increased work of breathing Neurologic: Grossly intact Psychiatric: mood appropriate, affect full    GAD 7 : Generalized Anxiety Score 09/08/2017  Nervous, Anxious, on Edge 1  Control/stop worrying 1  Worry too much - different things 1  Trouble relaxing 2  Restless 0  Easily annoyed or irritable 1  Afraid - awful might happen 0  Total GAD 7 Score 6  Anxiety Difficulty Somewhat difficult    Depression screen PHQ 2/9 09/08/2017  Decreased Interest 1  Down, Depressed, Hopeless 1  PHQ - 2 Score 2  Altered sleeping 1  Tired, decreased energy 2  Change in appetite 0  Feeling bad or failure about yourself  2  Trouble concentrating 0  Moving slowly or fidgety/restless 0  Suicidal thoughts 0  PHQ-9 Score 7  Difficult doing work/chores Somewhat difficult    Depression screen PHQ 2/9 09/08/2017  Decreased Interest 1  Down, Depressed, Hopeless 1  PHQ - 2 Score 2  Altered sleeping 1  Tired, decreased energy 2  Change in appetite 0  Feeling bad or failure about yourself  2  Trouble concentrating 0  Moving slowly or fidgety/restless 0  Suicidal thoughts 0  PHQ-9 Score 7  Difficult doing work/chores Somewhat difficult     Assessment: 32 y.o. G2P1001 follow up  medication management postpartum depression/anxiety  Plan: Problem List Items Addressed This Visit    None    Visit Diagnoses    Postpartum depression    -  Primary   Relevant Medications   escitalopram (LEXAPRO) 20 MG tablet      1) Depression and anxiety - partial response to 10mg  Lexapro.  PHQ-9 is 7, GAD-7 is 6 increase lexapro to 20mg .  No SI/HI.  She is particularly concerned about a trip to [facility] later this month with new born.  Juggling needs of her daughter with attention and time required taking care of a new born.  2) A total of 15 minutes were spent in face-to-face contact with the patient during this encounter with over half of  that time devoted to counseling and coordination of care.  3) Return in about 1 month (around 10/06/2017) for medication follow up.    Vena AustriaAndreas Jayleah Garbers, MD, Evern CoreFACOG [facility], [facility] 09/09/2017, 1:36 PM

## 2017-10-04 ENCOUNTER — Other Ambulatory Visit: Payer: Self-pay | Admitting: Obstetrics and Gynecology

## 2017-10-08 ENCOUNTER — Ambulatory Visit (INDEPENDENT_AMBULATORY_CARE_PROVIDER_SITE_OTHER): Payer: BLUE CROSS/BLUE SHIELD | Admitting: Obstetrics and Gynecology

## 2017-10-08 ENCOUNTER — Encounter: Payer: Self-pay | Admitting: Obstetrics and Gynecology

## 2017-10-08 VITALS — BP 102/66 | HR 77

## 2017-10-08 DIAGNOSIS — O99345 Other mental disorders complicating the puerperium: Secondary | ICD-10-CM | POA: Diagnosis not present

## 2017-10-08 DIAGNOSIS — F53 Postpartum depression: Secondary | ICD-10-CM

## 2017-10-08 MED ORDER — ESCITALOPRAM OXALATE 20 MG PO TABS: 20.0000 mg | ORAL_TABLET | Freq: Every day | ORAL | 11 refills | Status: DC

## 2017-10-08 NOTE — Progress Notes (Signed)
Obstetrics & Gynecology Office Visit   Chief Complaint:  Chief Complaint  Patient presents with  . Medication follow up    Anxiety and Depression    History of Present Illness: The patient is a 32 y.o. female presenting follow up for symptoms of depression.  The patient is currently taking lexapro 20mg  for the management of her symptoms.  She has not had any recent situational stressors.  She reports symptoms of insomnia.  She denies anhedonia, day time somnolence, insomnia, risk taking behavior, irritability, increased appetite, decreased appetite, social anxiety, agorophobia, feelings of guilt, feelings of worthlessness, suicidal ideation, homicidal ideation, auditory hallucinations and visual hallucinations. Symptoms have improved since last visit.     Review of Systems: Review of systems negative unless otherwise noted in HPI  Past Medical History:  Past Medical History:  Diagnosis Date  . H/O shoulder dystocia in prior pregnancy, currently pregnant    G1  . Infertility management   . PCOS (polycystic ovarian syndrome)   . Pre-diabetes   . Pruritic urticarial papules and plaques of pregnancy    with G1  . Third degree perineal laceration    G1    Past Surgical History:  Past Surgical History:  Procedure Laterality Date  . COLPOSCOPY N/A 2012  . KNEE SURGERY Right 2013  . SHOULDER SURGERY Left 2009  . TONSILLECTOMY AND ADENOIDECTOMY      Gynecologic History: No LMP recorded.  Obstetric History: G2P1001  Family History:  Family History  Problem Relation Age of Onset  . Diabetes Mother   . Diabetes Maternal Grandmother   . Arthritis Maternal Grandmother   . Stroke Maternal Grandmother   . Kidney failure Maternal Grandmother   . Hypertension Maternal Grandmother   . Heart disease Maternal Grandfather   . Kidney failure Maternal Grandfather   . Cardiomyopathy Paternal Grandmother   . Cardiomyopathy Paternal Grandfather   . Breast cancer Other     Social  History:  Social History   Socioeconomic History  . Marital status: Married    Spouse name: Not on file  . Number of children: Not on file  . Years of education: Not on file  . Highest education level: Not on file  Occupational History  . Not on file  Social Needs  . Financial resource strain: Not on file  . Food insecurity:    Worry: Not on file    Inability: Not on file  . Transportation needs:    Medical: Not on file    Non-medical: Not on file  Tobacco Use  . Smoking status: Never Smoker  . Smokeless tobacco: Never Used  Substance and Sexual Activity  . Alcohol use: No    Alcohol/week: 0.0 oz  . Drug use: No  . Sexual activity: Yes    Birth control/protection: None  Lifestyle  . Physical activity:    Days per week: Not on file    Minutes per session: Not on file  . Stress: Not on file  Relationships  . Social connections:    Talks on phone: Not on file    Gets together: Not on file    Attends religious service: Not on file    Active member of club or organization: Not on file    Attends meetings of clubs or organizations: Not on file    Relationship status: Not on file  . Intimate partner violence:    Fear of current or ex partner: Not on file    Emotionally abused: Not  on file    Physically abused: Not on file    Forced sexual activity: Not on file  Other Topics Concern  . Not on file  Social History Narrative  . Not on file    Allergies:  Allergies  Allergen Reactions  . Azithromycin Hives    Medications: Prior to Admission medications   Medication Sig Start Date End Date Taking? Authorizing Provider  escitalopram (LEXAPRO) 20 MG tablet Take 1 tablet (20 mg total) by mouth daily. 10/08/17  Yes Vena Austria, MD  metFORMIN (GLUCOPHAGE-XR) 500 MG 24 hr tablet Take 1 tablet (500 mg total) by mouth daily with breakfast. 08/07/17  Yes Vena Austria, MD  Prenatal Vit-Fe Fumarate-FA (MULTIVITAMIN-PRENATAL) 27-0.8 MG TABS tablet Take 1 tablet by  mouth daily at 12 noon.   Yes [provider]  progesterone (PROMETRIUM) 100 MG capsule Take 1 capsule (100 mg total) by mouth daily. Place one capsule vaginally at bedtime 06/17/17  Yes Vena Austria, MD    Physical Exam Vitals:  Vitals:   10/08/17 1553  BP: 102/66  Pulse: 77   No LMP recorded.  General: NAD HEENT: normocephalic, anicteric Pulmonary: No increased work of breathing Neurologic: Grossly intact Psychiatric: mood appropriate, affect full    GAD 7 : Generalized Anxiety Score 10/08/2017 09/08/2017  Nervous, Anxious, on Edge 1 1  Control/stop worrying 0 1  Worry too much - different things 1 1  Trouble relaxing 1 2  Restless 0 0  Easily annoyed or irritable 1 1  Afraid - awful might happen 0 0  Total GAD 7 Score 4 6  Anxiety Difficulty Somewhat difficult Somewhat difficult    Depression screen [facility] 2/9 10/08/2017 09/08/2017  Decreased Interest 0 1  Down, Depressed, Hopeless 1 1  PHQ - 2 Score 1 2  Altered sleeping 2 1  Tired, decreased energy 2 2  Change in appetite 0 0  Feeling bad or failure about yourself  1 2  Trouble concentrating 0 0  Moving slowly or fidgety/restless 0 0  Suicidal thoughts 0 0  PHQ-9 Score 6 7  Difficult doing work/chores Somewhat difficult Somewhat difficult    Depression screen [facility] 2/9 10/08/2017 09/08/2017  Decreased Interest 0 1  Down, Depressed, Hopeless 1 1  PHQ - 2 Score 1 2  Altered sleeping 2 1  Tired, decreased energy 2 2  Change in appetite 0 0  Feeling bad or failure about yourself  1 2  Trouble concentrating 0 0  Moving slowly or fidgety/restless 0 0  Suicidal thoughts 0 0  PHQ-9 Score 6 7  Difficult doing work/chores Somewhat difficult Somewhat difficult     Assessment: 32 y.o. G2P1001 follow up postpartum depression  Plan: Problem List Items Addressed This Visit    None    Visit Diagnoses    Postpartum depression    -  Primary   Relevant Medications   escitalopram (LEXAPRO) 20 MG tablet        1) Depression  - GAD-7 is 4 - PHQ-9 is 6 item 9 is 0 - Good response to lexapro dose increase.  Continue at 20mg    2) A total of 15 minutes were spent in face-to-face contact with the patient during this encounter with over half of that time devoted to counseling and coordination of care.  3) Return in about 1 year (around 10/09/2018) for annual.    Vena Austria, MD, [facility], [facility]

## 2018-04-27 ENCOUNTER — Other Ambulatory Visit: Payer: Self-pay | Admitting: Obstetrics and Gynecology

## 2018-04-27 MED ORDER — ESCITALOPRAM OXALATE 10 MG PO TABS: 20.0000 mg | ORAL_TABLET | Freq: Every day | ORAL | 5 refills | Status: DC

## 2018-06-07 ENCOUNTER — Encounter: Payer: Self-pay | Admitting: Obstetrics and Gynecology

## 2018-06-07 ENCOUNTER — Ambulatory Visit (INDEPENDENT_AMBULATORY_CARE_PROVIDER_SITE_OTHER): Payer: BLUE CROSS/BLUE SHIELD | Admitting: Obstetrics and Gynecology

## 2018-06-07 VITALS — BP 120/84 | HR 73 | Ht 67.0 in | Wt 230.0 lb

## 2018-06-07 DIAGNOSIS — N939 Abnormal uterine and vaginal bleeding, unspecified: Secondary | ICD-10-CM | POA: Diagnosis not present

## 2018-06-07 DIAGNOSIS — K439 Ventral hernia without obstruction or gangrene: Secondary | ICD-10-CM | POA: Diagnosis not present

## 2018-06-07 DIAGNOSIS — Z3202 Encounter for pregnancy test, result negative: Secondary | ICD-10-CM | POA: Diagnosis not present

## 2018-06-07 DIAGNOSIS — E282 Polycystic ovarian syndrome: Secondary | ICD-10-CM

## 2018-06-07 LAB — POCT URINE PREGNANCY: Preg Test, Ur: NEGATIVE

## 2018-06-07 NOTE — Progress Notes (Addendum)
Patient, No Pcp Per   Chief Complaint  Patient presents with  . Vaginal Bleeding    started after christmas, heavy flows every other week, severe cramping     HPI:      Ms. Katyana Naccarato is a 33 y.o. G2P1001 who LMP was No LMP recorded., presents today for irregular bleeding since 12/19. Menses are about Q2 wks, lasting 5-6 days, mod flow, occas with dime sized clots. Pt with mild to mod dysmen, improved with NSAIDs. She is breastfeeding about 3-4 times daily now. She is sex active, issues with female infertility in past (had to do IUI with last pregnancy). She is sex active, not using BC.  Hx of PCOS/luteal phase defect and managed with metformin and prometrium 200 mg QHS or prog 4% crm in past (previous provider). Dr. Bonney Aid wrote for 100 mg prometrium at [facility] visit. Pt thinks 200 mg dose worked better.  Pt had normal pap 5/19.  No recent thyroid check.  Pt also with abd bulge with valsalva maneuver since last pregnancy. Concerned about diastasis recti. Also tender if pushed in that area/leans on something.  12/17 NOTE: She has a hx of PCOS and is on prog crm 4%, 1 ml daily with sx relief of insomnia, PMS sx, and fatigue. She is still getting monthly menses, lasting 5-7 days with med flow. She denies BTB. She conceived with her daughter while on prometrium 200 mg daily. Her husband recently lost a lot of wt and is having issues with extra skin covering his penis and affecting function. His urologist suggested IUI for the couple since they would like to conceive soon and he needs further wt loss before treatment.  She is still taking metformin 500 mg QHS for pre-DM.  Past Medical History:  Diagnosis Date  . H/O shoulder dystocia in prior pregnancy, currently pregnant    G1  . Infertility management   . PCOS (polycystic ovarian syndrome)   . Pre-diabetes   . Pruritic urticarial papules and plaques of pregnancy    with G1  . Third degree perineal laceration    G1    Past Surgical  History:  Procedure Laterality Date  . COLPOSCOPY N/A 2012  . KNEE SURGERY Right 2013  . SHOULDER SURGERY Left 2009  . TONSILLECTOMY AND ADENOIDECTOMY      Family History  Problem Relation Age of Onset  . Diabetes Mother   . Diabetes Maternal Grandmother   . Arthritis Maternal Grandmother   . Stroke Maternal Grandmother   . Kidney failure Maternal Grandmother   . Hypertension Maternal Grandmother   . Heart disease Maternal Grandfather   . Kidney failure Maternal Grandfather   . Cardiomyopathy Paternal Grandmother   . Cardiomyopathy Paternal Grandfather   . Breast cancer Other     Social History   Socioeconomic History  . Marital status: Married    Spouse name: Not on file  . Number of children: Not on file  . Years of education: Not on file  . Highest education level: Not on file  Occupational History  . Not on file  Social Needs  . Financial resource strain: Not on file  . Food insecurity:    Worry: Not on file    Inability: Not on file  . Transportation needs:    Medical: Not on file    Non-medical: Not on file  Tobacco Use  . Smoking status: Never Smoker  . Smokeless tobacco: Never Used  Substance and Sexual Activity  . Alcohol  use: No    Alcohol/week: 0.0 standard drinks  . Drug use: No  . Sexual activity: Yes    Birth control/protection: None  Lifestyle  . Physical activity:    Days per week: Not on file    Minutes per session: Not on file  . Stress: Not on file  Relationships  . Social connections:    Talks on phone: Not on file    Gets together: Not on file    Attends religious service: Not on file    Active member of club or organization: Not on file    Attends meetings of clubs or organizations: Not on file    Relationship status: Not on file  . Intimate partner violence:    Fear of current or ex partner: Not on file    Emotionally abused: Not on file    Physically abused: Not on file    Forced sexual activity: Not on file  Other Topics  Concern  . Not on file  Social History Narrative  . Not on file    Outpatient Medications Prior to Visit  Medication Sig Dispense Refill  . metFORMIN (GLUCOPHAGE-XR) 500 MG 24 hr tablet Take 1 tablet (500 mg total) by mouth daily with breakfast. 90 tablet 3  . Prenatal Vit-Fe Fumarate-FA (MULTIVITAMIN-PRENATAL) 27-0.8 MG TABS tablet Take 1 tablet by mouth daily at 12 noon.    . progesterone (PROMETRIUM) 100 MG capsule Take 1 capsule (100 mg total) by mouth daily. Place one capsule vaginally at bedtime 90 capsule 3  . escitalopram (LEXAPRO) 10 MG tablet Take 2 tablets (20 mg total) by mouth daily. 30 tablet 5   No facility-administered medications prior to visit.       ROS:  Review of Systems  Constitutional: Positive for fatigue. Negative for fever and unexpected weight change.  Respiratory: Negative for cough, shortness of breath and wheezing.   Cardiovascular: Negative for chest pain, palpitations and leg swelling.  Gastrointestinal: Negative for blood in stool, constipation, diarrhea, nausea and vomiting.  Endocrine: Negative for cold intolerance, heat intolerance and polyuria.  Genitourinary: Positive for menstrual problem. Negative for dyspareunia, dysuria, flank pain, frequency, genital sores, hematuria, pelvic pain, urgency, vaginal bleeding, vaginal discharge and vaginal pain.  Musculoskeletal: Positive for arthralgias. Negative for back pain, joint swelling and myalgias.  Skin: Negative for rash.  Neurological: Negative for dizziness, syncope, light-headedness, numbness and headaches.  Hematological: Negative for adenopathy.  Psychiatric/Behavioral: Negative for agitation, confusion, sleep disturbance and suicidal ideas. The patient is not nervous/anxious.     OBJECTIVE:   Vitals:  BP 120/84   Pulse 73   Ht  (1.702 m)   Wt 230 lb (104.3 kg)   Breastfeeding Yes   BMI 36.02 kg/m   Physical Exam Vitals signs reviewed.  Constitutional:      Appearance: She is  well-developed.  Neck:     Musculoskeletal: Normal range of motion.  Pulmonary:     Effort: Pulmonary effort is normal.  Abdominal:     Palpations: Abdomen is soft. There is mass.     Hernia: A hernia is present. Hernia is present in the umbilical area and ventral area.    Genitourinary:    General: Normal vulva.     Pubic Area: No rash.      Labia:        Right: No rash, tenderness or lesion.        Left: No rash, tenderness or lesion.      Vagina: Normal. No vaginal  discharge, erythema or tenderness.     Cervix: Normal.     Uterus: Normal. Not enlarged and not tender.      Adnexa: Right adnexa normal and left adnexa normal.       Right: No mass or tenderness.         Left: No mass or tenderness.    Musculoskeletal: Normal range of motion.  Neurological:     Mental Status: She is alert and oriented to person, place, and time.  Psychiatric:        Behavior: Behavior normal.        Thought Content: Thought content normal.    Assessment/Plan: Abnormal uterine bleeding (AUB) - Neg UPT. Check labs and GYN u/s. Will f/u wiht results. If normal, most likely due to transition in BF. Can do BC to control cycles.  - Plan: POCT urine pregnancy, TSH + free T4, US PELVIS TRANSVANGINAL NON-OB (TV ONLY)  Bilateral polycystic ovarian syndrome - May want to increase prometrium to 200 mg daily since controlled sx in past.  Ventral hernia without obstruction or gangrene - Ventral vs umb hernia, reducible. Pt to consider gen surg ref.    Return in about 1 day (around 06/08/2018) for GYN u/s for AUB--[facility] to call pt.  Athelene Hursey B. Heloise Gordan, PA-C 06/09/2018 11:02 AM

## 2018-06-07 NOTE — Patient Instructions (Signed)
I value your feedback and entrusting us with your care. If you get a [place] patient survey, I would appreciate you taking the time to let us know about your experience today. Thank you! 

## 2018-06-08 ENCOUNTER — Ambulatory Visit (INDEPENDENT_AMBULATORY_CARE_PROVIDER_SITE_OTHER): Payer: BLUE CROSS/BLUE SHIELD

## 2018-06-08 DIAGNOSIS — N8302 Follicular cyst of left ovary: Secondary | ICD-10-CM

## 2018-06-08 DIAGNOSIS — N939 Abnormal uterine and vaginal bleeding, unspecified: Secondary | ICD-10-CM | POA: Diagnosis not present

## 2018-06-08 DIAGNOSIS — N8301 Follicular cyst of right ovary: Secondary | ICD-10-CM

## 2018-06-08 LAB — TSH+FREE T4
Free T4: 0.81 ng/dL — ABNORMAL LOW (ref 0.82–1.77)
TSH: 3.84 u[IU]/mL (ref 0.450–4.500)

## 2018-06-08 NOTE — Progress Notes (Signed)
Pt aware.

## 2018-06-08 NOTE — Progress Notes (Signed)
Pls let pt know labs normal. Pt is Breastfeeding (affects free T4 value). Will wait for u/s results. Thx

## 2018-06-09 ENCOUNTER — Telehealth: Payer: Self-pay | Admitting: Obstetrics and Gynecology

## 2018-06-09 DIAGNOSIS — E282 Polycystic ovarian syndrome: Secondary | ICD-10-CM

## 2018-06-09 DIAGNOSIS — K439 Ventral hernia without obstruction or gangrene: Secondary | ICD-10-CM

## 2018-06-09 MED ORDER — PROGESTERONE MICRONIZED 200 MG PO CAPS: 200.0000 mg | ORAL_CAPSULE | Freq: Every day | ORAL | 0 refills | Status: DC

## 2018-06-09 NOTE — Telephone Encounter (Signed)
Pt aware of normal GYN u/s (hx of PCOS). Pt having irregular bleeding but also weaning down from breastfeeding. Had normal labs. Most likely hormonal. Pt on prometrium 100 mg, used to be on 200 mg daily before last pregnancy. Change Rx to 200 mg daily. F/u at 5/20 annual/sooner prn. If still abn, will try BC, per pt pref.  Pt also wants ref to gen surg for ventral vs umb hernia eval.   ULTRASOUND REPORT  Location: [facility]  Date of Service: 06/08/2018    Indications:AUB Findings:  The uterus is retroverted and measures 7.8 x 6.3 x 4.9cm. Echo texture is homogenous without evidence of focal masses.  The Endometrium measures 7.8 mm.  Right Ovary measures 3.7 x 3.1 x 2.2 cm.  Left Ovary measures 3.2 x 2.7 x 1.8 cm. Multiple small peripheral follicles on bilateral ovaries. Ultrasound appearance of PCOS. Survey of the adnexa demonstrates no adnexal masses. Trace amount of fluid in cul de sac.   Impression: 1. Possible PCOS, otherwise normal gyn ultrasound.   Recommendations: 1.Clinical correlation with the patient's History and Physical Exam.  Darlina Guys, RDMS RVT

## 2018-06-11 ENCOUNTER — Other Ambulatory Visit: Payer: Self-pay | Admitting: Obstetrics and Gynecology

## 2018-06-17 ENCOUNTER — Ambulatory Visit: Payer: BLUE CROSS/BLUE SHIELD | Admitting: General Surgery

## 2018-06-22 ENCOUNTER — Ambulatory Visit: Payer: BLUE CROSS/BLUE SHIELD | Admitting: General Surgery

## 2018-07-18 IMAGING — US US OB COMP +14 WK
1 series · 13 of 28 positions shown · non-contrast
Comparison: none

CLINICAL DATA: 31-year-old pregnant female presents for evaluation
of fetal growth and AFI. History of shoulder dystocia in prior
pregnancy.

EDC by LMP: 06/02/2017, projecting to an expected gestational age of
38 weeks 2 days.
EXAM:
OBSTETRICAL ULTRASOUND >14 WKS

[Series 1: us ob comp +14 wk · 0.25mm/px · 13 of 55 slices shown]
[im 3/55]
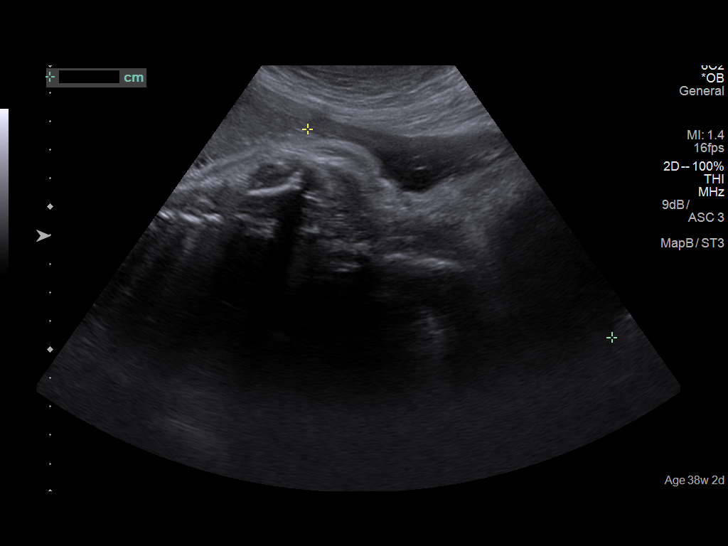
[im 7/55]
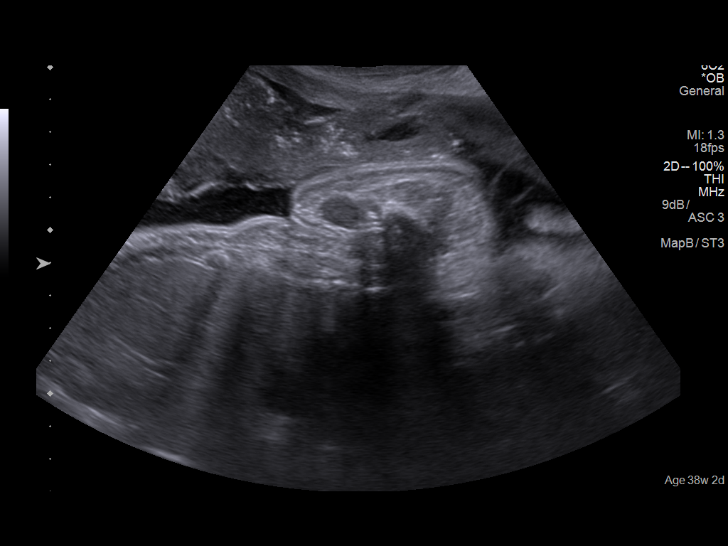
[im 11/55]
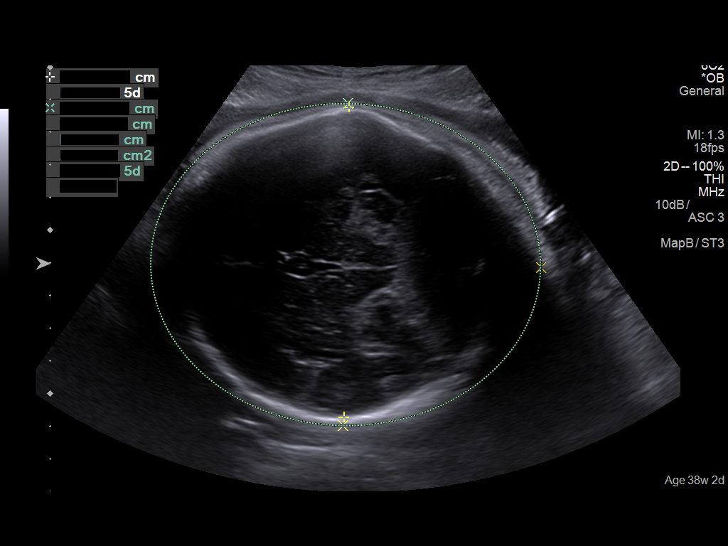
[im 15/55]
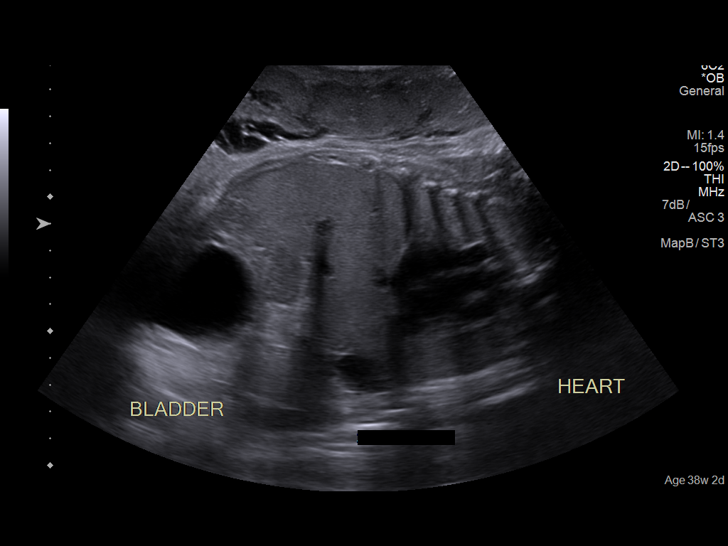
[im 19/55]
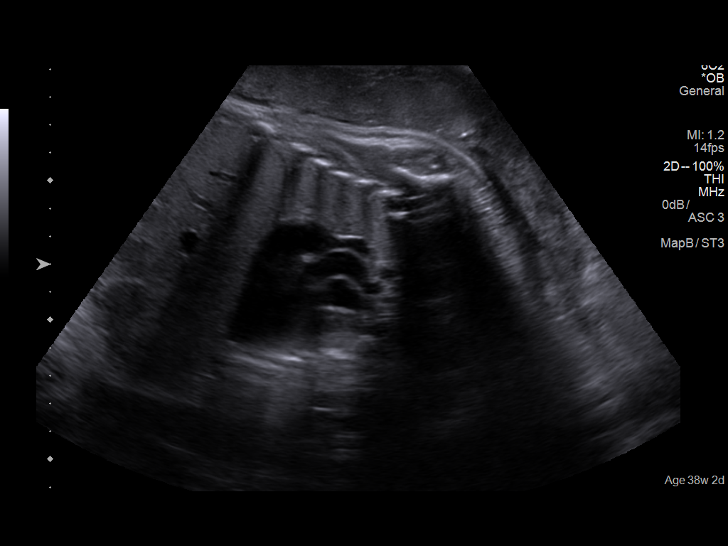
[im 23/55]
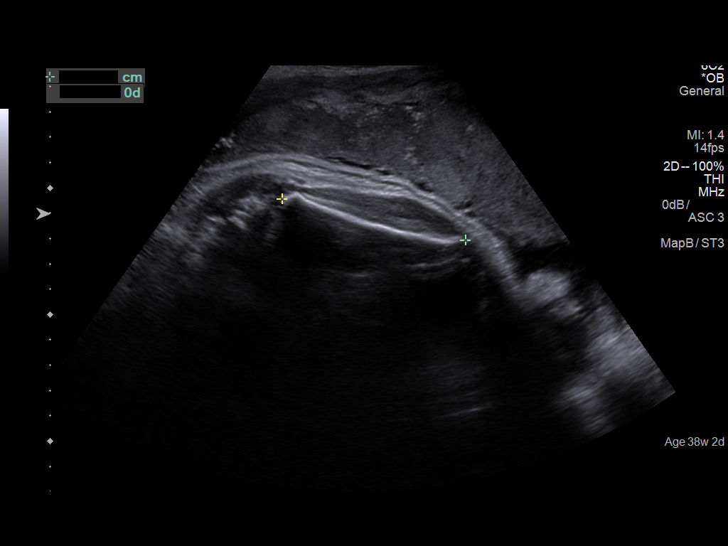
[im 29/55]
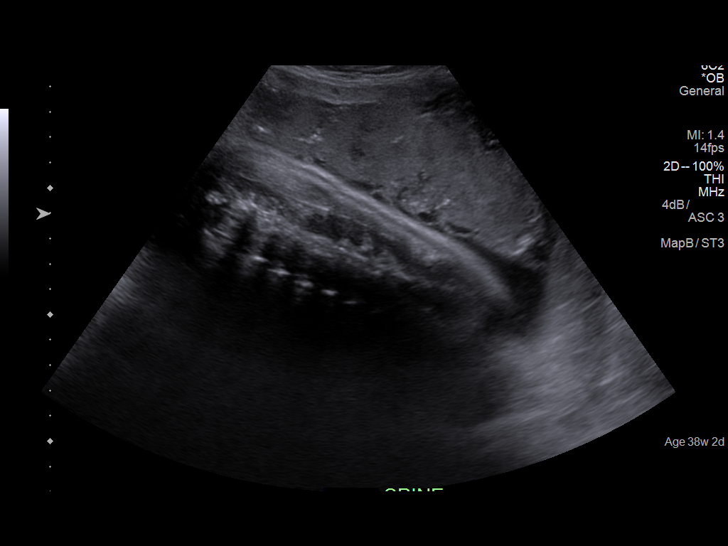
[im 33/55]
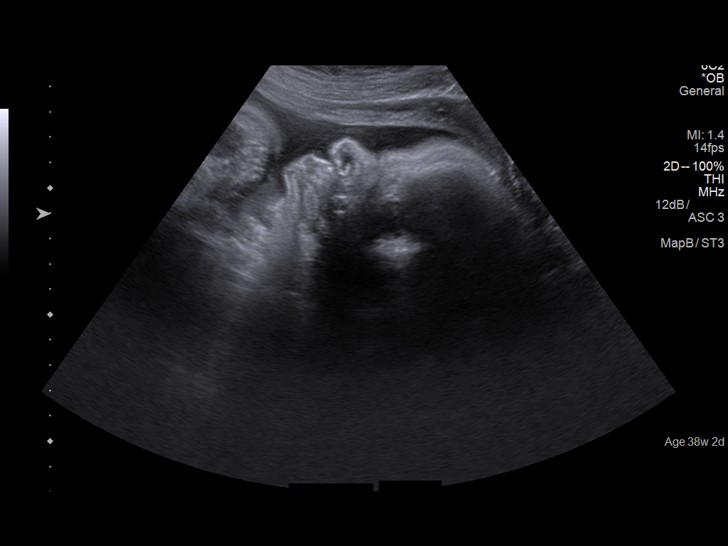
[im 37/55]
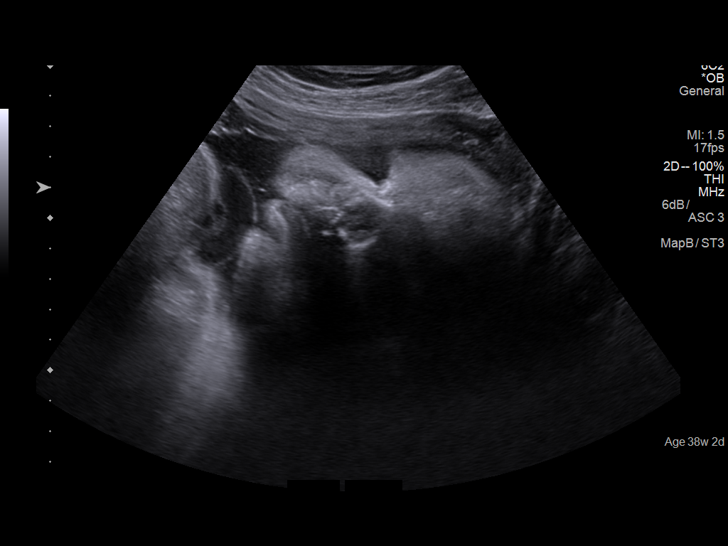
[im 41/55]
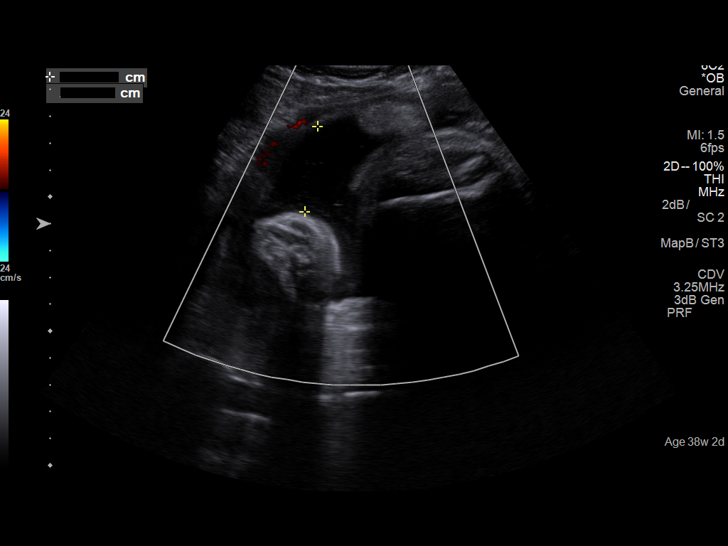
[im 45/55]
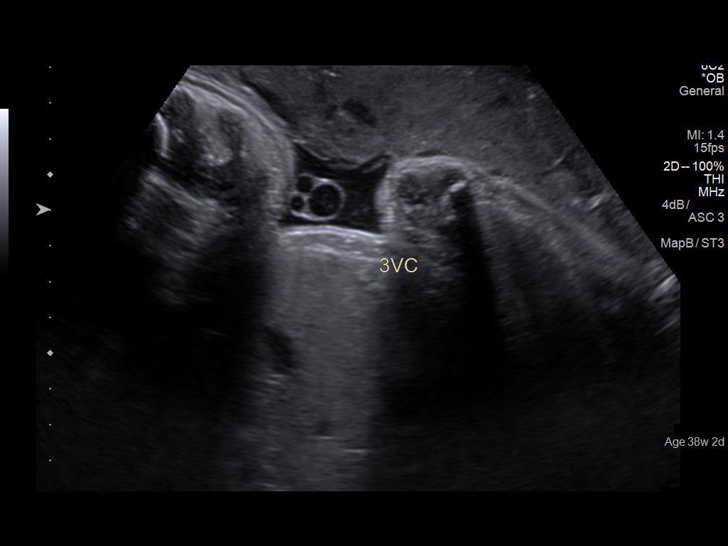
[im 49/55]
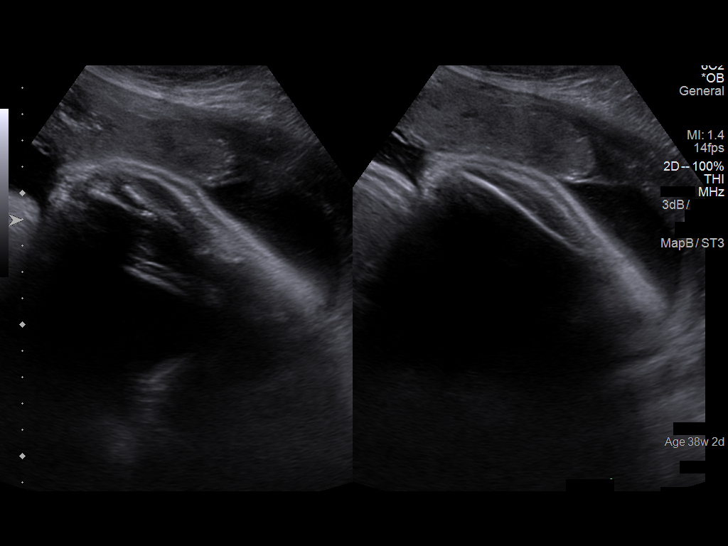
[im 53/55]
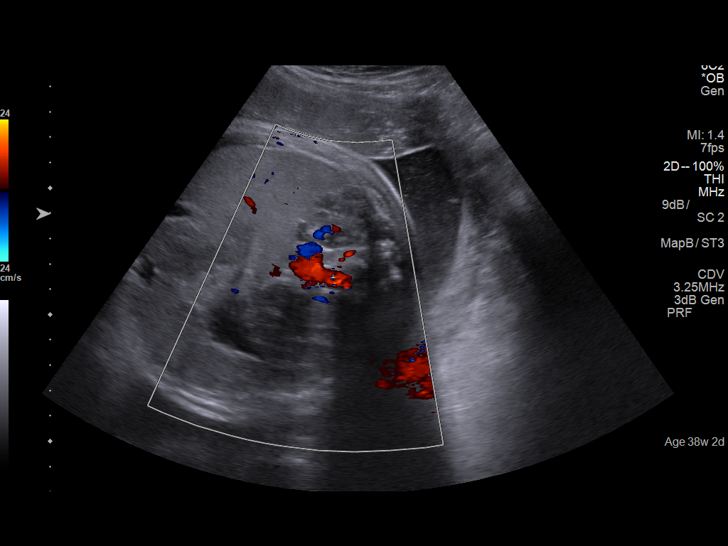

[13 of 28 positions shown; findings below may reference images not displayed]

FINDINGS: Number of Fetuses: 1

Heart Rate:  133 bpm

Movement: Yes

Presentation: Cephalic

Previa: No

Placental Location: Anterior

Amniotic Fluid (Subjective): Normal

Amniotic Fluid (Objective):

Vertical pocket 5.4cm

AFI 8.8 cm (5%ile= 7.3 cm, 95%= 23.9 cm for 38 wks)

FETAL BIOMETRY

BPD:  9.5cm 38w 4d

HC:    34.0cm 39w 0d

AC:   37.1cm 41w 0d

FL:   7.4cm 37w 6d

HL:  6.0cm 35w 0d

Current Mean GA: 38w 2d US EDC: 06/02/2017

Estimated Fetal Weight:  3,917g 93%ile

FETAL ANATOMY

Lateral Ventricles: Appears normal

Thalami/CSP: Limited views appear normal

Posterior Fossa:  Not visualized

Upper Lip: Appears normal

Spine: Not well visualized

4 Chamber Heart on Left: Appears normal

LVOT: Appears normal

RVOT: Not visualized

Stomach on Left: Appears normal

3 Vessel Cord: Appears normal

Cord Insertion site: Not visualized

Kidneys: Appears normal

Bladder: Appears normal

Extremities: Limited views appear normal

Technically difficult due to: Advanced gestational age.

Maternal Findings:

Cervix:  Not evaluated, obscured by fetal head position.
IMPRESSION: 1. Single living intrauterine gestation in cephalic lie at 38 weeks
2 days by average ultrasound age, concordant with provided menstrual
dating.
2. Estimated fetal weight 7322 g, at the 93rd percentile, compatible
with large for gestational age.
3. Normal amniotic fluid volume.  AFI 8.8.
4. No fetal or maternal abnormalities detected, noting significant
limitations due to advanced gestational age.

## 2018-07-27 ENCOUNTER — Ambulatory Visit: Payer: BLUE CROSS/BLUE SHIELD | Admitting: General Surgery

## 2018-08-24 ENCOUNTER — Ambulatory Visit: Payer: BLUE CROSS/BLUE SHIELD | Admitting: General Surgery

## 2018-08-30 DIAGNOSIS — K429 Umbilical hernia without obstruction or gangrene: Secondary | ICD-10-CM

## 2018-08-30 HISTORY — DX: Umbilical hernia without obstruction or gangrene: K42.9

## 2018-08-31 ENCOUNTER — Ambulatory Visit (INDEPENDENT_AMBULATORY_CARE_PROVIDER_SITE_OTHER): Payer: BLUE CROSS/BLUE SHIELD | Admitting: General Surgery

## 2018-08-31 ENCOUNTER — Encounter: Payer: Self-pay | Admitting: General Surgery

## 2018-08-31 ENCOUNTER — Other Ambulatory Visit: Payer: Self-pay

## 2018-08-31 VITALS — BP 128/78 | HR 80 | Temp 98.1°F | Ht 67.0 in | Wt 222.0 lb

## 2018-08-31 DIAGNOSIS — M6208 Separation of muscle (nontraumatic), other site: Secondary | ICD-10-CM

## 2018-08-31 DIAGNOSIS — K429 Umbilical hernia without obstruction or gangrene: Secondary | ICD-10-CM

## 2018-08-31 NOTE — Patient Instructions (Signed)
Laparoscopic Ventral Hernia Repair Laparoscopic ventral hernia repairis a procedure to fix a bulge of tissue that pushes through a weak area of muscle in the abdomen (ventral hernia). A ventral hernia may be at the belly button (umbilical), above the belly button (epigastric), or at the incision site from previous abdominal surgery (incisional hernia). You may have this procedure as emergency surgery if part of your intestine gets trapped inside the hernia and starts to lose its blood supply (strangulation). Laparoscopic surgery is done through small incisions using a thin surgical telescope with a light and camera on the end (laparoscope). During surgery, your surgeon will use images from the laparoscope to guide the procedure. A mesh screen will be placed in the hernia to close the opening and strengthen the abdominal wall. Tell a health care provider about:  Any allergies you have.  All medicines you are taking, including vitamins, herbs, eye drops, creams, and over-the-counter medicines.  Any problems you or family members have had with anesthetic medicines.  Any blood disorders you have.  Any surgeries you have had.  Any medical conditions you have.  Whether you are pregnant or may be pregnant. What are the risks? Generally, this is a safe procedure. However, problems may occur, including:  Infection.  Bleeding.  Allergic reactions to medicines.  Damage to other structures or organs in the abdomen.  Trouble urinating or having a bowel movement after surgery.  Pneumonia.  Blood clots.  The hernia coming back after surgery.  Fluid buildup in the area of the hernia. In some cases, your health care provider may need to switch from a laparoscopic procedure to a procedure that is done through a single, larger incision in the abdomen (open procedure). You may need an open procedure if:  You have a hernia that is difficult to repair.  Your organs are hard to see.  You have  bleeding problems during the laparoscopic procedure. What happens before the procedure? Staying hydrated Follow instructions from your health care provider about hydration, which may include:  Up to 2 hours before the procedure - you may continue to drink clear liquids, such as water, clear fruit juice, black coffee, and plain tea. Eating and drinking restrictions Follow instructions from your health care provider about eating and drinking, which may include:  8 hours before the procedure - stop eating heavy meals or foods such as meat, fried foods, or fatty foods.  6 hours before the procedure - stop eating light meals or foods, such as toast or cereal.  6 hours before the procedure - stop drinking milk or drinks that contain milk.  2 hours before the procedure - stop drinking clear liquids. Medicines  Ask your health care provider about: ? Changing or stopping your regular medicines. This is especially important if you are taking diabetes medicines or blood thinners. ? Taking medicines such as aspirin and ibuprofen. These medicines can thin your blood. Do not take these medicines before your procedure if your health care provider instructs you not to.  You may be given antibiotic medicine to help prevent infection. General instructions   You may be asked to take a laxative or do an enema to empty your bowel before surgery (bowel prep).  Do not use any products that contain nicotine or tobacco, such as cigarettes and e-cigarettes. If you need help quitting, ask your health care provider.  You may need to have tests before the procedure, such as: ? Blood tests. ? Urine tests. ? Abdominal ultrasound. ?   Chest X-ray. ? Electrocardiogram (ECG).  Plan to have someone take you home from the hospital or clinic.  If you will be going home right after the procedure, plan to have someone with you for 24 hours. What happens during the procedure?  To reduce your risk of  infection: ? Your health care team will wash or sanitize their hands. ? Your skin will be washed with soap.  An IV tube will be inserted into one of your veins.  You will be given one or more of the following: ? A medicine to help you relax (sedative). ? A medicine to make you fall asleep (general anesthetic).  A small incision will be made in your abdomen. A hollow metal tube (trocar) will be placed through the incision.  A tube will be placed through the trocar to inflate your abdomen with air-like gas. This makes it easier for your surgeon to see inside your abdomen and do the repair.  The laparoscope will be inserted into your abdomen through the trocar. The laparoscope will send images to a monitor in the operating room.  Other trocars will be put through other small incisions in your abdomen. The surgical instruments needed for the procedure will be placed through these trocars.  The tissue or intestines that make up the hernia will be moved back into place.  The edges of the hernia may be stitched together.  A piece of mesh will be used to close the hernia. Stitches (sutures), clips, or staples will be used to keep the mesh in place.  A bandage (dressing) or skin glue will be put over the incisions. The procedure may vary among health care providers and hospitals. What happens after the procedure?  Your blood pressure, heart rate, breathing rate, and blood oxygen level will be monitored until the medicines you were given have worn off.  You will continue to receive fluids and medicines through an IV tube. Your IV tube will be removed when you can drink clear fluids.  You will be given pain medicine as needed.  You will be encouraged to get up and walk around as soon as possible.  You may have to wear compression stockings. These stockings help to prevent blood clots and reduce swelling in your legs.  You will be shown how to do deep breathing exercises to help prevent a  lung infection.  Do not drive for 24 hours if you were given a sedative. This information is not intended to replace advice given to you by your health care provider. Make sure you discuss any questions you have with your health care provider. Document Released: 03/03/2012 Document Revised: 11/02/2015 Document Reviewed: 11/02/2015 Elsevier Interactive Patient Education  2019 [employer] Inc[employer]  

## 2018-08-31 NOTE — Progress Notes (Signed)
Patient ID: Kathy Bastara Boutin, female   DOB: Jan 11, 1986, 33 y.o.   MRN: [id number]  Chief Complaint  Patient presents with  . Other    ventral hernia    HPI Kathy Williamson is a 33 y.o. female here today for a  evaluation of a ventral hernia .Patient states she noticed this area about five months ago. Tender to bend over. She states she has been moving to a new place, so she has been lifting some heavy stuff. HPI  Past Medical History:  Diagnosis Date  . H/O shoulder dystocia in prior pregnancy, currently pregnant    G1  . Infertility management   . PCOS (polycystic ovarian syndrome)   . Pre-diabetes   . Pruritic urticarial papules and plaques of pregnancy    with G1  . Third degree perineal laceration    G1    Past Surgical History:  Procedure Laterality Date  . COLPOSCOPY N/A 2012  . KNEE SURGERY Right 2013  . SHOULDER SURGERY Left 2009  . TONSILLECTOMY AND ADENOIDECTOMY      Family History  Problem Relation Age of Onset  . Diabetes Mother   . Diabetes Maternal Grandmother   . Arthritis Maternal Grandmother   . Stroke Maternal Grandmother   . Kidney failure Maternal Grandmother   . Hypertension Maternal Grandmother   . Heart disease Maternal Grandfather   . Kidney failure Maternal Grandfather   . Cardiomyopathy Paternal Grandmother   . Cardiomyopathy Paternal Grandfather   . Breast cancer Other     Social History Social History   Tobacco Use  . Smoking status: Never Smoker  . Smokeless tobacco: Never Used  Substance Use Topics  . Alcohol use: No    Alcohol/week: 0.0 standard drinks  . Drug use: No    Allergies  Allergen Reactions  . Azithromycin Hives    Current Outpatient Medications  Medication Sig Dispense Refill  . metFORMIN (GLUCOPHAGE-XR) 500 MG 24 hr tablet Take 1 tablet (500 mg total) by mouth daily with breakfast. 90 tablet 3  . Prenatal Vit-Fe Fumarate-FA (MULTIVITAMIN-PRENATAL) 27-0.8 MG TABS tablet Take 1 tablet by mouth daily at 12 noon.    .  progesterone (PROMETRIUM) 200 MG capsule      No current facility-administered medications for this visit.     Review of Systems Review of Systems  Constitutional: Negative.   Respiratory: Negative.   Cardiovascular: Negative.     Blood pressure 128/78, pulse 80, temperature 98.1 F (36.7 C), temperature source Skin, height 5\' 7"  (1.702 m), weight 222 lb (100.7 kg), SpO2 97 %, currently breastfeeding.  Physical Exam Physical Exam Constitutional:      Appearance: Normal appearance. She is well-developed.  Eyes:     General: No scleral icterus.    Conjunctiva/sclera: Conjunctivae normal.  Neck:     Musculoskeletal: Neck supple.  Cardiovascular:     Rate and Rhythm: Normal rate and regular rhythm.     Heart sounds: Normal heart sounds.  Pulmonary:     Effort: Pulmonary effort is normal.     Breath sounds: Normal breath sounds.  Abdominal:     General: Abdomen is flat. Bowel sounds are normal.     Palpations: Abdomen is soft.     Tenderness: There is no abdominal tenderness.    Lymphadenopathy:     Cervical: No cervical adenopathy.  Skin:    General: Skin is warm and dry.  Neurological:     Mental Status: She is alert and oriented to person, place, and time.  Data Reviewed GYN notes of June 07, 2018.  Assessment Small umbilical hernia, small area of diastases.  Plan  The patient presently has children 6 and 15 months.  Still nursing.  Has done fairly well with weight loss post pregnancy but has some more to go by her report.  I would be reluctant to intervene at this time for the small umbilical hernia, and diastases surgery for such a small it is unlikely to be of long-term benefit.  The patient may exercise as tolerated, taking care to use good form.  She reports that most of her exercise program is mixed martial arts.  Obviously, avoiding blows to this area would be appropriate.  Unless the area significantly changes observation alone at this time.  She is  welcome return at any time if she has increasing discomfort or other concerns.  Return as needed.The patient is aware to call back for any questions or concerns.   HPI, Physical Exam, Assessment and Plan have been scribed under the direction and in the presence of Donnalee Curry, MD.  Ples Specter, CMA   I have completed the exam and reviewed the above documentation for accuracy and completeness.  I agree with the above.  Museum/gallery conservator has been used and any errors in dictation or transcription are unintentional.  Donnalee Curry, M.D., F.A.C.S.  Kathy Williamson 08/31/2018, 8:00 PM

## 2018-09-08 ENCOUNTER — Other Ambulatory Visit: Payer: Self-pay | Admitting: Obstetrics and Gynecology

## 2018-09-08 DIAGNOSIS — E282 Polycystic ovarian syndrome: Secondary | ICD-10-CM

## 2018-09-08 NOTE — Telephone Encounter (Signed)
Please advise 

## 2018-09-14 ENCOUNTER — Other Ambulatory Visit: Payer: Self-pay | Admitting: Obstetrics and Gynecology

## 2018-09-14 ENCOUNTER — Encounter: Payer: Self-pay | Admitting: Obstetrics and Gynecology

## 2018-09-14 MED ORDER — PROGESTERONE MICRONIZED 200 MG PO CAPS: 200.0000 mg | ORAL_CAPSULE | Freq: Every day | ORAL | 0 refills | Status: DC

## 2018-09-14 NOTE — Progress Notes (Signed)
Rx RF prometrium until she can get in for annual.

## 2018-10-07 ENCOUNTER — Other Ambulatory Visit: Payer: Self-pay | Admitting: Obstetrics and Gynecology

## 2018-10-12 ENCOUNTER — Other Ambulatory Visit: Payer: Self-pay | Admitting: Obstetrics and Gynecology

## 2018-10-12 ENCOUNTER — Encounter: Payer: Self-pay | Admitting: Obstetrics and Gynecology

## 2018-10-12 MED ORDER — PROGESTERONE MICRONIZED 200 MG PO CAPS: 200.0000 mg | ORAL_CAPSULE | Freq: Every day | ORAL | 0 refills | Status: DC

## 2018-10-12 NOTE — Progress Notes (Signed)
Rx RF prometrium until annual

## 2018-10-27 ENCOUNTER — Other Ambulatory Visit: Payer: Self-pay

## 2018-10-27 ENCOUNTER — Encounter: Payer: Self-pay | Admitting: Obstetrics and Gynecology

## 2018-10-27 ENCOUNTER — Ambulatory Visit (INDEPENDENT_AMBULATORY_CARE_PROVIDER_SITE_OTHER): Payer: BLUE CROSS/BLUE SHIELD | Admitting: Obstetrics and Gynecology

## 2018-10-27 VITALS — BP 100/72 | Ht 67.0 in | Wt 220.2 lb

## 2018-10-27 DIAGNOSIS — Z01419 Encounter for gynecological examination (general) (routine) without abnormal findings: Secondary | ICD-10-CM | POA: Diagnosis not present

## 2018-10-27 DIAGNOSIS — R7303 Prediabetes: Secondary | ICD-10-CM

## 2018-10-27 DIAGNOSIS — E282 Polycystic ovarian syndrome: Secondary | ICD-10-CM

## 2018-10-27 MED ORDER — PROGESTERONE MICRONIZED 200 MG PO CAPS: 200.0000 mg | ORAL_CAPSULE | Freq: Every day | ORAL | 3 refills | Status: DC

## 2018-10-27 MED ORDER — METFORMIN HCL ER 500 MG PO TB24: 500.0000 mg | ORAL_TABLET | Freq: Every day | ORAL | 3 refills | Status: DC

## 2018-10-27 NOTE — Patient Instructions (Signed)
I value your feedback and entrusting us with your care. If you get a [facility] patient survey, I would appreciate you taking the time to let us know about your experience today. Thank you! 

## 2018-10-27 NOTE — Progress Notes (Addendum)
Chief Complaint  Patient presents with  . Gynecologic Exam    HPI:      [name] is a 33 y.o. G1P1001 who LMP was Patient's last menstrual period was 10/22/2018 (exact date)., presents today for her annual examination.  She had her first regular menses since stopping breastfeeding 7/20, lasted 7 days, mod flow, no BTB, mild dysmen, improved with NSAIDs. Hx of PCOS, takes prometrium 200 mg daily, as well as metformin. This regulates her cycles. AUB from 3/20 resolved with progesterone dose increase to 200 mg daily (from 100 mg). Had neg labs/u/s and pt was still breastfeeding. Having issues with PMS last menses but could be related to breastfeeding cessation.  Sex activity: single partner--not currently active; husband has extra skin covering his penis affecting function; surgery is remedy and possibility in future.   Last Pap: 08/07/17  Results were: no abnormalities /neg HPV DNA  Hx of STDs: none  There is a FH of breast cancer in her mat grt aunt, genetic testing not indicated. There is no FH of ovarian cancer. The patient does do self-breast exams.  Tobacco use: The patient denies current or previous tobacco use. Alcohol use: none  Drug use: none Exercise: moderately active  She does get adequate calcium and Vitamin D in her diet. Hx of pre-DM, taking metformin. Due for labs.   Has umbilical hernia and diastasis recti. Saw Dr. Lemar LivingsByrnett 6/20, will follow expectantly for now.   Past Medical History:  Diagnosis Date  . H/O shoulder dystocia in prior pregnancy, currently pregnant    G1  . Infertility management   . PCOS (polycystic ovarian syndrome)   . Pre-diabetes   . Pruritic urticarial papules and plaques of pregnancy    with G1  . Third degree perineal laceration    G1  . Umbilical hernia 08/2018   can follow per Dr. Lemar LivingsByrnett    Past Surgical History:  Procedure Laterality Date  . COLPOSCOPY N/A 2012  . KNEE SURGERY Right 2013  . SHOULDER SURGERY Left 2009  .  TONSILLECTOMY AND ADENOIDECTOMY      Family History  Problem Relation Age of Onset  . Diabetes Mother   . Diabetes Maternal Grandmother   . Arthritis Maternal Grandmother   . Stroke Maternal Grandmother   . Kidney failure Maternal Grandmother   . Hypertension Maternal Grandmother   . Heart disease Maternal Grandfather   . Kidney failure Maternal Grandfather   . Cardiomyopathy Paternal Grandmother   . Cardiomyopathy Paternal Grandfather   . Breast cancer Other      ROS:  Review of Systems  Constitutional: Negative for fever, malaise/fatigue and weight loss.  HENT: Negative for congestion, ear pain and sinus pain.   Respiratory: Negative for cough, shortness of breath and wheezing.   Cardiovascular: Negative for chest pain, orthopnea and leg swelling.  Gastrointestinal: Negative for constipation, diarrhea, nausea and vomiting.  Genitourinary: Negative for dysuria, frequency, hematuria and urgency.       Breast ROS: negative   Musculoskeletal: Negative for back pain, joint pain and myalgias.  Skin: Negative for itching and rash.  Neurological: Negative for dizziness, tingling, focal weakness and headaches.  Endo/Heme/Allergies: Negative for environmental allergies. Does not bruise/bleed easily.  Psychiatric/Behavioral: Negative for depression and suicidal ideas. The patient is not nervous/anxious and does not have insomnia.     Objective: BP 100/72   Ht 5\' 7"  (1.702 m)   Wt 220 lb 3.2 oz (99.9 kg)   LMP 10/22/2018 (Exact Date)  Breastfeeding No   BMI 34.49 kg/m    Physical Exam Constitutional:      Appearance: She is well-developed.  Genitourinary:     Vulva, vagina, uterus, right adnexa and left adnexa normal.     No vulval lesion or tenderness noted.     No vaginal discharge, erythema or tenderness.     No cervical motion tenderness or polyp.     Uterus is not enlarged or tender.     No right or left adnexal mass present.     Right adnexa not tender.     Left  adnexa not tender.  Neck:     Musculoskeletal: Normal range of motion.     Thyroid: No thyromegaly.  Cardiovascular:     Rate and Rhythm: Normal rate and regular rhythm.     Heart sounds: Normal heart sounds. No murmur.  Pulmonary:     Effort: Pulmonary effort is normal.     Breath sounds: Normal breath sounds.  Chest:     Breasts:        Right: No mass, nipple discharge, skin change or tenderness.        Left: No mass, nipple discharge, skin change or tenderness.  Abdominal:     Palpations: Abdomen is soft.     Tenderness: There is no abdominal tenderness. There is no guarding.  Musculoskeletal: Normal range of motion.  Neurological:     General: No focal deficit present.     Mental Status: She is alert and oriented to person, place, and time.     Cranial Nerves: No cranial nerve deficit.  Skin:    General: Skin is warm and dry.  Psychiatric:        Mood and Affect: Mood normal.        Behavior: Behavior normal.        Thought Content: Thought content normal.        Judgment: Judgment normal.  Vitals signs reviewed.     Assessment/Plan: Encounter for annual routine gynecological examination -   Bilateral polycystic ovarian syndrome - Plan: progesterone (PROMETRIUM) 200 MG capsule, Controlled with progesterone daily. Rx RF. F/u prn.   Pre-diabetes - Plan: Comprehensive metabolic panel, Hemoglobin A1c, metFORMIN (GLUCOPHAGE-XR) 500 MG 24 hr tablet, Check labs. Rx RF metformin. F/u prn.    Meds ordered this encounter  Medications  . metFORMIN (GLUCOPHAGE-XR) 500 MG 24 hr tablet    Sig: Take 1 tablet (500 mg total) by mouth daily with breakfast.    Dispense:  90 tablet    Refill:  3    Order Specific Question:   Supervising Provider    Answer:   Gae Dry [id number]  . progesterone (PROMETRIUM) 200 MG capsule    Sig: Take 1 capsule (200 mg total) by mouth daily.    Dispense:  90 capsule    Refill:  3    Order Specific Question:   Supervising Provider    Answer:    Gae Dry [[id number]]               GYN counsel adequate intake of calcium and vitamin D, diet and exercise     F/U  Return in about 1 day (around 10/28/2018) for lab appt.  Josaiah Muhammed B. Raaga Maeder, PA-C 10/27/2018 4:44 PM

## 2018-11-01 ENCOUNTER — Other Ambulatory Visit: Payer: BLUE CROSS/BLUE SHIELD

## 2018-11-01 ENCOUNTER — Other Ambulatory Visit: Payer: Self-pay

## 2018-11-01 DIAGNOSIS — R7303 Prediabetes: Secondary | ICD-10-CM

## 2018-11-02 ENCOUNTER — Encounter: Payer: Self-pay | Admitting: Obstetrics and Gynecology

## 2018-11-02 LAB — COMPREHENSIVE METABOLIC PANEL
ALT: 13 IU/L (ref 0–32)
AST: 13 IU/L (ref 0–40)
Albumin/Globulin Ratio: 2.2 (ref 1.2–2.2)
Albumin: 4.4 g/dL (ref 3.8–4.8)
Alkaline Phosphatase: 71 IU/L (ref 39–117)
BUN/Creatinine Ratio: 14 (ref 9–23)
BUN: 10 mg/dL (ref 6–20)
Bilirubin Total: 0.3 mg/dL (ref 0.0–1.2)
CO2: 22 mmol/L (ref 20–29)
Calcium: 8.9 mg/dL (ref 8.7–10.2)
Chloride: 104 mmol/L (ref 96–106)
Creatinine, Ser: 0.71 mg/dL (ref 0.57–1.00)
GFR calc Af Amer: 128 mL/min/{1.73_m2} (ref >59)
GFR calc non Af Amer: 111 mL/min/{1.73_m2} (ref >59)
Globulin, Total: 2 g/dL (ref 1.5–4.5)
Glucose: 91 mg/dL (ref 65–99)
Potassium: 4.1 mmol/L (ref 3.5–5.2)
Sodium: 141 mmol/L (ref 134–144)
Total Protein: 6.4 g/dL (ref 6.0–8.5)

## 2018-11-02 LAB — HEMOGLOBIN A1C
Est. average glucose Bld gHb Est-mCnc: 126 mg/dL
Hgb A1c MFr Bld: 6 % — ABNORMAL HIGH (ref 4.8–5.6)

## 2019-05-16 ENCOUNTER — Telehealth: Payer: Self-pay

## 2019-05-16 NOTE — Telephone Encounter (Signed)
She can do telephone appt. [place] Dec, pls call pt to sched for AUB. Thx.

## 2019-05-16 NOTE — Telephone Encounter (Signed)
Patient is schedule for 05/17/19 at 4:30

## 2019-05-16 NOTE — Telephone Encounter (Signed)
Pt calling to see if she needs face-to-face or telephone appt/  Her period is extremely irregular still and she has PMS sxs.  Cycle has not worked itself out.  [phone]

## 2019-05-17 ENCOUNTER — Ambulatory Visit (INDEPENDENT_AMBULATORY_CARE_PROVIDER_SITE_OTHER): Payer: BLUE CROSS/BLUE SHIELD | Admitting: Obstetrics and Gynecology

## 2019-05-17 ENCOUNTER — Encounter: Payer: Self-pay | Admitting: Obstetrics and Gynecology

## 2019-05-17 ENCOUNTER — Other Ambulatory Visit: Payer: Self-pay

## 2019-05-17 DIAGNOSIS — Z0000 Encounter for general adult medical examination without abnormal findings: Secondary | ICD-10-CM

## 2019-05-17 DIAGNOSIS — F3281 Premenstrual dysphoric disorder: Secondary | ICD-10-CM | POA: Insufficient documentation

## 2019-05-17 DIAGNOSIS — Z131 Encounter for screening for diabetes mellitus: Secondary | ICD-10-CM

## 2019-05-17 DIAGNOSIS — E282 Polycystic ovarian syndrome: Secondary | ICD-10-CM

## 2019-05-17 DIAGNOSIS — N926 Irregular menstruation, unspecified: Secondary | ICD-10-CM

## 2019-05-17 DIAGNOSIS — Z1329 Encounter for screening for other suspected endocrine disorder: Secondary | ICD-10-CM

## 2019-05-17 DIAGNOSIS — N925 Other specified irregular menstruation: Secondary | ICD-10-CM

## 2019-05-17 NOTE — Patient Instructions (Signed)
I value your feedback and entrusting us with your care. If you get a [facility] patient survey, I would appreciate you taking the time to let us know about your experience today. Thank you!  As of March 10, 2019, your lab results will be released to your MyChart immediately, before I even have a chance to see them. Please give me time to review them and contact you if there are any abnormalities. Thank you for your patience.  

## 2019-05-17 NOTE — Progress Notes (Signed)
Virtual Visit via Telephone Note  I connected with Kathy Williamson on 05/17/19 at  4:30 PM EST by telephone and verified that I am speaking with the correct person using two identifiers.   I discussed the limitations, risks, security and privacy concerns of performing an evaluation and management service by telephone and the availability of in person appointments. I also discussed with the patient that there may be a patient responsible charge related to this service. The patient expressed understanding and agreed to proceed. Location of pt: home Location of provider: [facility] Name of CMA for history intake: Donnetta Hail    Chief Complaint  Patient presents with  . Metrorrhagia    last regular cycle was Oct... extreme fatigue, cramping, mood swings    HPI:      [name] is a 34 y.o. [id number] who LMP was No LMP recorded., presents today for irregular bleeding since 9/20. Having some type of monthly spotting/dark blood, lasting 5-6 days. Used to have 7 days of moderate flow prior to last pregnancy. Stopped breastfeeding 7/20, last appt with me 10/27/18. Has had 2 months with mid-cycle spotting for a few days. Has mild dysmen with bleeding. Hx of PCOS/luteal phase defect and managed with metformin and prometrium 200 mg QHS or prog 4% crm in past (previous provider). Currently on prometrium 200 mg QHS and metformin 500 mg QD.  Also with mood changes/anger/emotional lability luteal phase. Hx of PP and regular depression in past, improved with wellbutrin. Didn't like lexapro post partum. Has issues with severe fatigue, no change in sleep patterns. Always has trouble falling asleep (improved with prometrium) but sleeps well once asleep. She is not exercising. Has been under increased stress with covid, virtual learning, and possibly a future move out of state. No wt change.  Hx of pre-DM 8/20.   7/20 NOTES: She had her first regular menses since stopping breastfeeding 7/20,  lasted 7 days, mod flow, no BTB, mild dysmen, improved with NSAIDs. Hx of PCOS, takes prometrium 200 mg daily, as well as metformin. This regulates her cycles. AUB from 3/20 resolved with progesterone dose increase to 200 mg daily (from 100 mg). Had neg labs/u/s and pt was still breastfeeding. Having issues with PMS last menses but could be related to breastfeeding cessation.   Past Medical History:  Diagnosis Date  . H/O shoulder dystocia in prior pregnancy, currently pregnant    G1  . Infertility management   . PCOS (polycystic ovarian syndrome)   . Pre-diabetes   . Pruritic urticarial papules and plaques of pregnancy    with G1  . Third degree perineal laceration    G1  . Umbilical hernia 08/2018   can follow per Dr. Lemar Livings    Past Surgical History:  Procedure Laterality Date  . COLPOSCOPY N/A 2012  . KNEE SURGERY Right 2013  . SHOULDER SURGERY Left 2009  . TONSILLECTOMY AND ADENOIDECTOMY      Family History  Problem Relation Age of Onset  . Diabetes Mother   . Diabetes Maternal Grandmother   . Arthritis Maternal Grandmother   . Stroke Maternal Grandmother   . Kidney failure Maternal Grandmother   . Hypertension Maternal Grandmother   . Heart disease Maternal Grandfather   . Kidney failure Maternal Grandfather   . Cardiomyopathy Paternal Grandmother   . Cardiomyopathy Paternal Grandfather   . Breast cancer Other      Current Outpatient Medications:  .  metFORMIN (GLUCOPHAGE-XR) 500 MG 24 hr  tablet, Take 1 tablet (500 mg total) by mouth daily with breakfast., Disp: 90 tablet, Rfl: 3 .  progesterone (PROMETRIUM) 200 MG capsule, Take 1 capsule (200 mg total) by mouth daily., Disp: 90 capsule, Rfl: 3   OBJECTIVE:    Physical Exam Pulmonary:     Effort: Pulmonary effort is normal.  Neurological:     General: No focal deficit present.     Mental Status: She is alert and oriented to person, place, and time.  Psychiatric:        Mood and Affect: Mood normal.         Behavior: Behavior normal.        Thought Content: Thought content normal.        Judgment: Judgment normal.      Assessment/Plan: Irregular menses - Plan: TSH + free T4, Prolactin; Hx of PCOS, on daily prometrium and metformin with cycle control for many yrs. Check labs. Will f/u with results. If neg, light bleeding may be new normal. Has had 2 episodes AUB in past 4 months. Can also check GYN u/s. If neg, reassurance.   PMDD (premenstrual dysphoric disorder)--discussed zoloft/prozac during luteal phase vs tx for depression again with wellbutrin.   PCOS (polycystic ovarian syndrome)  Blood tests for routine general physical examination - Plan: TSH + free T4, Prolactin, Hemoglobin A1c  Thyroid disorder screening - Plan: TSH + free T4  Screening for diabetes mellitus - Plan: Hemoglobin A1c  I discussed the assessment and treatment plan with the patient. The patient was provided an opportunity to ask questions and all were answered. The patient agreed with the plan and demonstrated an understanding of the instructions.   The patient was advised to call back or seek an in-person evaluation if the symptoms worsen or if the condition fails to improve as anticipated.  I provided 18.29 minutes of non-face-to-face time during this encounter.    B. , PA-C 05/17/2019 4:59 PM

## 2019-05-18 ENCOUNTER — Encounter: Payer: Self-pay | Admitting: Obstetrics and Gynecology

## 2019-05-23 ENCOUNTER — Other Ambulatory Visit: Payer: BLUE CROSS/BLUE SHIELD

## 2019-05-23 ENCOUNTER — Other Ambulatory Visit: Payer: Self-pay

## 2019-05-23 DIAGNOSIS — Z0000 Encounter for general adult medical examination without abnormal findings: Secondary | ICD-10-CM

## 2019-05-23 DIAGNOSIS — Z1329 Encounter for screening for other suspected endocrine disorder: Secondary | ICD-10-CM

## 2019-05-23 DIAGNOSIS — N926 Irregular menstruation, unspecified: Secondary | ICD-10-CM

## 2019-05-23 DIAGNOSIS — Z131 Encounter for screening for diabetes mellitus: Secondary | ICD-10-CM

## 2019-05-24 ENCOUNTER — Telehealth: Payer: Self-pay | Admitting: Obstetrics and Gynecology

## 2019-05-24 DIAGNOSIS — F419 Anxiety disorder, unspecified: Secondary | ICD-10-CM

## 2019-05-24 DIAGNOSIS — F329 Major depressive disorder, single episode, unspecified: Secondary | ICD-10-CM

## 2019-05-24 LAB — PROLACTIN: Prolactin: 12 ng/mL (ref 4.8–23.3)

## 2019-05-24 LAB — TSH+FREE T4
Free T4: 1.06 ng/dL (ref 0.82–1.77)
TSH: 1.64 u[IU]/mL (ref 0.450–4.500)

## 2019-05-24 LAB — HEMOGLOBIN A1C
Est. average glucose Bld gHb Est-mCnc: 117 mg/dL
Hgb A1c MFr Bld: 5.7 % — ABNORMAL HIGH (ref 4.8–5.6)

## 2019-05-24 MED ORDER — BUPROPION HCL ER (SR) 150 MG PO TB12: 150.0000 mg | ORAL_TABLET | Freq: Every day | ORAL | 1 refills | Status: DC

## 2019-05-24 NOTE — Progress Notes (Signed)
Pls call pt with labs results and also do GAD/PHQ for me. Pls send to me and tell pt I will then call her back to f/u. Thx.

## 2019-05-24 NOTE — Telephone Encounter (Signed)
Discussed GAD/PHQ results. Pt with anxiety and depression sx. Discussed SSRI vs wellbutrin 150 mg QD, done in past with good sx control and no side effects. Pt would prefer to try wellbutrin again. Rx eRxd. F/u via phone in 7 wks/sooner prn. See if helps with PMDD sx as well.  GAD 7 : Generalized Anxiety Score 05/24/19 09/08/2017  Nervous, Anxious, on Edge 3 1  Control/stop worrying 1 1  Worry too much - different things 1 1  Trouble relaxing 3 2  Restless 1 0  Easily annoyed or irritable 3 1  Afraid - awful might happen 2 0  Total GAD 7 Score 14 6  Anxiety Difficulty Extremely difficult Somewhat difficult   Depression screen PHQ 2/9 05/24/2019  Decreased Interest 1  Down, Depressed, Hopeless 2  PHQ - 2 Score 3  Altered sleeping 2  Tired, decreased energy 3  Change in appetite 1  Feeling bad or failure about yourself  2  Trouble concentrating 0  Moving slowly or fidgety/restless 1  Suicidal thoughts 0  PHQ-9 Score 12  Difficult doing work/chores Very difficult

## 2019-05-25 ENCOUNTER — Encounter: Payer: Self-pay | Admitting: Obstetrics and Gynecology

## 2019-05-25 ENCOUNTER — Other Ambulatory Visit: Payer: Self-pay | Admitting: Obstetrics and Gynecology

## 2019-05-25 MED ORDER — ESCITALOPRAM OXALATE 10 MG PO TABS: 10.0000 mg | ORAL_TABLET | Freq: Every day | ORAL | 1 refills | Status: DC

## 2019-05-25 NOTE — Telephone Encounter (Signed)
ADDENDUM: Pt emailed back through MyChart stating upon review of her hx, she did fine with lexapro afterall. Rx changed since will help with her anxiety/depression sx better.

## 2019-05-25 NOTE — Progress Notes (Signed)
Rx lexapro for anxiety/depression sx. Pt decided would rather do this than wellbutrin.

## 2019-06-16 ENCOUNTER — Other Ambulatory Visit: Payer: Self-pay | Admitting: Obstetrics and Gynecology

## 2019-07-26 ENCOUNTER — Other Ambulatory Visit: Payer: Self-pay | Admitting: Obstetrics and Gynecology

## 2019-08-01 ENCOUNTER — Encounter: Payer: Self-pay | Admitting: Obstetrics and Gynecology

## 2019-08-01 ENCOUNTER — Other Ambulatory Visit: Payer: Self-pay | Admitting: Obstetrics and Gynecology

## 2019-08-01 MED ORDER — ESCITALOPRAM OXALATE 10 MG PO TABS: 10.0000 mg | ORAL_TABLET | Freq: Every day | ORAL | 0 refills | Status: DC

## 2019-08-01 NOTE — Progress Notes (Signed)
Rx lexapro RF till 7/21 annual

## 2019-11-08 ENCOUNTER — Other Ambulatory Visit: Payer: Self-pay | Admitting: Obstetrics and Gynecology

## 2019-11-12 ENCOUNTER — Other Ambulatory Visit: Payer: Self-pay | Admitting: Obstetrics and Gynecology

## 2019-11-12 DIAGNOSIS — R7303 Prediabetes: Secondary | ICD-10-CM

## 2019-11-12 DIAGNOSIS — E282 Polycystic ovarian syndrome: Secondary | ICD-10-CM

## 2019-11-14 NOTE — Telephone Encounter (Signed)
Annual scheduled 9/9

## 2019-12-07 NOTE — Progress Notes (Signed)
Chief Complaint  Patient presents with   Annual Exam    HPI:      Kathy Williamson is a 34 y.o. G1P1001 who LMP was Patient's last menstrual period was 11/19/2019., presents today for her annual examination.  Her menses are now every 2-3 wks (after stopping breastfeeding last yr), usually 6 days, some light flow, some mod flow. With dysmen, improved with NSAIDs. Had normal TSH/prolactin 2/21. Hx of PCOS, takes prometrium 200 mg daily, as well as metformin. This used to regulate her cycles, although she has had to take prometrium cyclically for cycle control in past. Pt prefers daily prog due to sleep issues and feels better with it.   Sex activity: single partner--no dyspareunia; husband has extra skin covering his penis affecting function; surgery is remedy and possibility early 2022  Last Pap: 08/07/17  Results were: no abnormalities /neg HPV DNA  Hx of STDs: none  There is a FH of breast cancer in her mat grt aunt, genetic testing not indicated. There is no FH of ovarian cancer. The patient does do self-breast exams.  Tobacco use: The patient denies current or previous tobacco use. Alcohol use: none  Drug use: none Exercise: very active  She does get adequate calcium and Vitamin D in her diet. Hx of pre-DM, taking metformin. Did labs 2/21, HgA1C=5.7. Will cont metformin  Has umbilical hernia and diastasis recti. Saw Dr. Lemar Livings 6/20, will follow expectantly for now.  Started lexapro 2/21 for anxiety/depression sx with sx improvement. Some stressors have resolved and pt has weaned off lexapro. Doing ok for now. Will call for Rx RF prn.    Past Medical History:  Diagnosis Date   H/O shoulder dystocia in prior pregnancy, currently pregnant    G1   Infertility management    PCOS (polycystic ovarian syndrome)    Pre-diabetes    Pruritic urticarial papules and plaques of pregnancy    with G1   Third degree perineal laceration    G1   Umbilical hernia 08/2018   can follow  per Dr. Lemar Livings    Past Surgical History:  Procedure Laterality Date   COLPOSCOPY N/A 2012   KNEE SURGERY Right 2013   SHOULDER SURGERY Left 2009   TONSILLECTOMY AND ADENOIDECTOMY      Family History  Problem Relation Age of Onset   Diabetes Mother    Diabetes Maternal Grandmother    Arthritis Maternal Grandmother    Stroke Maternal Grandmother    Kidney failure Maternal Grandmother    Hypertension Maternal Grandmother    Heart disease Maternal Grandfather    Kidney failure Maternal Grandfather    Cardiomyopathy Paternal Grandmother    Cardiomyopathy Paternal Grandfather    Breast cancer Other      ROS: Review of Systems  Constitutional: Positive for fatigue. Negative for fever and unexpected weight change.  Respiratory: Negative for cough, shortness of breath and wheezing.   Cardiovascular: Negative for chest pain, palpitations and leg swelling.  Gastrointestinal: Negative for blood in stool, constipation, diarrhea, nausea and vomiting.  Endocrine: Negative for cold intolerance, heat intolerance and polyuria.  Genitourinary: Positive for menstrual problem. Negative for dyspareunia, dysuria, flank pain, frequency, genital sores, hematuria, pelvic pain, urgency, vaginal bleeding, vaginal discharge and vaginal pain.  Musculoskeletal: Negative for back pain, joint swelling and myalgias.  Skin: Negative for rash.  Neurological: Negative for dizziness, syncope, light-headedness, numbness and headaches.  Hematological: Negative for adenopathy.  Psychiatric/Behavioral: Negative for agitation, confusion, sleep disturbance and suicidal ideas. The patient  is not nervous/anxious.     Objective: BP 122/74    Ht 5\' 7"  (1.702 m)    Wt 223 lb (101.2 kg)    LMP 11/19/2019    BMI 34.93 kg/m    Physical Exam Constitutional:      Appearance: She is well-developed.  Genitourinary:     Vulva, vagina, uterus, right adnexa and left adnexa normal.     No vulval lesion or  tenderness noted.     No vaginal discharge, erythema or tenderness.     No cervical motion tenderness or polyp.     Uterus is not enlarged or tender.     No right or left adnexal mass present.     Right adnexa not tender.     Left adnexa not tender.  Neck:     Thyroid: No thyromegaly.  Cardiovascular:     Rate and Rhythm: Normal rate and regular rhythm.     Heart sounds: Normal heart sounds. No murmur heard.   Pulmonary:     Effort: Pulmonary effort is normal.     Breath sounds: Normal breath sounds.  Chest:     Breasts:        Right: No mass, nipple discharge, skin change or tenderness.        Left: No mass, nipple discharge, skin change or tenderness.  Abdominal:     Palpations: Abdomen is soft.     Tenderness: There is no abdominal tenderness. There is no guarding.  Musculoskeletal:        General: Normal range of motion.     Cervical back: Normal range of motion.  Neurological:     General: No focal deficit present.     Mental Status: She is alert and oriented to person, place, and time.     Cranial Nerves: No cranial nerve deficit.  Skin:    General: Skin is warm and dry.  Psychiatric:        Mood and Affect: Mood normal.        Behavior: Behavior normal.        Thought Content: Thought content normal.        Judgment: Judgment normal.  Vitals reviewed.     Assessment/Plan: Encounter for annual routine gynecological examination  Abnormal uterine bleeding (AUB) - Plan: 11/21/2019 PELVIS TRANSVAGINAL NON-OB (TV ONLY); Check GYN u/s. Will f/u with results. If neg, will try cyclic prog instead of daily.   PCOS (polycystic ovarian syndrome)  Pre-diabetes - Plan: metFORMIN (GLUCOPHAGE-XR) 500 MG 24 hr tablet; Rx RF eRxd. Pt doing intense cardio/wt lifting classes now  Anxiety and depression--improved, d/c'd lexapro. F/u prn Rx RF.   Meds ordered this encounter  Medications   metFORMIN (GLUCOPHAGE-XR) 500 MG 24 hr tablet    Sig: TAKE 1 TABLET BY MOUTH EVERY DAY WITH  BREAKFAST    Dispense:  90 tablet    Refill:  3    Order Specific Question:   Supervising Provider    Answer:   [place] Nadara Mustard               GYN counsel adequate intake of calcium and vitamin D, diet and exercise     F/U  Return in about 1 day (around 12/09/2019) for GYN u/s for AUB--[facility] to call pt.  Ankur Snowdon B. Terran Hollenkamp, PA-C 12/08/2019 9:10 AM

## 2019-12-07 NOTE — Patient Instructions (Signed)
I value your feedback and entrusting us with your care. If you get a [facility] patient survey, I would appreciate you taking the time to let us know about your experience today. Thank you!  As of March 10, 2019, your lab results will be released to your MyChart immediately, before I even have a chance to see them. Please give me time to review them and contact you if there are any abnormalities. Thank you for your patience.  

## 2019-12-08 ENCOUNTER — Encounter: Payer: Self-pay | Admitting: Obstetrics and Gynecology

## 2019-12-08 ENCOUNTER — Other Ambulatory Visit: Payer: Self-pay

## 2019-12-08 ENCOUNTER — Ambulatory Visit (INDEPENDENT_AMBULATORY_CARE_PROVIDER_SITE_OTHER): Payer: BLUE CROSS/BLUE SHIELD | Admitting: Obstetrics and Gynecology

## 2019-12-08 VITALS — BP 122/74 | Ht 67.0 in | Wt 223.0 lb

## 2019-12-08 DIAGNOSIS — R7303 Prediabetes: Secondary | ICD-10-CM

## 2019-12-08 DIAGNOSIS — Z01419 Encounter for gynecological examination (general) (routine) without abnormal findings: Secondary | ICD-10-CM

## 2019-12-08 DIAGNOSIS — F32A Depression, unspecified: Secondary | ICD-10-CM | POA: Insufficient documentation

## 2019-12-08 DIAGNOSIS — F329 Major depressive disorder, single episode, unspecified: Secondary | ICD-10-CM

## 2019-12-08 DIAGNOSIS — N939 Abnormal uterine and vaginal bleeding, unspecified: Secondary | ICD-10-CM | POA: Diagnosis not present

## 2019-12-08 DIAGNOSIS — F419 Anxiety disorder, unspecified: Secondary | ICD-10-CM

## 2019-12-08 DIAGNOSIS — E282 Polycystic ovarian syndrome: Secondary | ICD-10-CM | POA: Insufficient documentation

## 2019-12-08 MED ORDER — METFORMIN HCL ER 500 MG PO TB24: ORAL_TABLET | ORAL | 3 refills | Status: DC

## 2019-12-11 ENCOUNTER — Other Ambulatory Visit: Payer: Self-pay | Admitting: Obstetrics and Gynecology

## 2019-12-11 DIAGNOSIS — E282 Polycystic ovarian syndrome: Secondary | ICD-10-CM

## 2019-12-15 ENCOUNTER — Ambulatory Visit: Payer: BLUE CROSS/BLUE SHIELD

## 2019-12-21 ENCOUNTER — Ambulatory Visit: Payer: BLUE CROSS/BLUE SHIELD | Admitting: Podiatry

## 2019-12-22 ENCOUNTER — Telehealth: Payer: Self-pay | Admitting: Obstetrics and Gynecology

## 2019-12-22 ENCOUNTER — Other Ambulatory Visit: Payer: Self-pay | Admitting: Obstetrics and Gynecology

## 2019-12-22 ENCOUNTER — Ambulatory Visit (INDEPENDENT_AMBULATORY_CARE_PROVIDER_SITE_OTHER): Payer: BLUE CROSS/BLUE SHIELD

## 2019-12-22 ENCOUNTER — Other Ambulatory Visit: Payer: Self-pay

## 2019-12-22 DIAGNOSIS — N939 Abnormal uterine and vaginal bleeding, unspecified: Secondary | ICD-10-CM | POA: Diagnosis not present

## 2019-12-22 DIAGNOSIS — E282 Polycystic ovarian syndrome: Secondary | ICD-10-CM

## 2019-12-22 MED ORDER — PROGESTERONE 200 MG PO CAPS: ORAL_CAPSULE | ORAL | 0 refills | Status: DC

## 2019-12-22 NOTE — Telephone Encounter (Signed)
Pt aware of neg GYN u/s for irreg menses. Menses have been Q2-3 wks the past yr. Does daily prometrium and metformin to help regulate cycles due to PCOS. Declines BC. Will try prometrium days 12-25 to see if cycling controls cycles. Ironically, LMP 1 mo ago.  Pt aware of simple RTO cyst, no pain. RTO in 12 wks for recheck. Pt is moving towards [place]. May need u/s down there. F/u prn.   ULTRASOUND REPORT  Location: [facility]  Date of Service: 12/22/2019    Indications:Abnormal Uterine Bleeding   Findings:  The uterus is retroverted and measures 8.8 x 7.6 x 7.0 cm. Echo texture is homogenous without evidence of focal masses. The Endometrium measures 4.8 mm.  Right Ovary measures 5.8 x 5.3 x 4.6 cm. There is a simple ovarian cyst and a dominant follicle in the right ovary. They measure 43.4 x 39.7 x 37.1 mm and 16.7 x 7.7 x 10.5 mm.  Left Ovary measures 3.7 x 3.0 x 1.5 cm. It is normal in appearance. Survey of the adnexa demonstrates no adnexal masses. There is no free fluid in the cul de sac.  Impression: 1. There is a 4.3 cm simple cyst in the right ovary.  3. Normal appearing uterus, cervix and left ovary.   Recommendations: 1.Clinical correlation with the patient's History and Physical Exam.   Deanna Artis, RT

## 2019-12-22 NOTE — Progress Notes (Signed)
Rx prometrium 200 mg RF

## 2019-12-28 ENCOUNTER — Ambulatory Visit (INDEPENDENT_AMBULATORY_CARE_PROVIDER_SITE_OTHER): Payer: BLUE CROSS/BLUE SHIELD | Admitting: Podiatry

## 2019-12-28 ENCOUNTER — Other Ambulatory Visit: Payer: Self-pay

## 2019-12-28 ENCOUNTER — Ambulatory Visit (INDEPENDENT_AMBULATORY_CARE_PROVIDER_SITE_OTHER): Payer: BLUE CROSS/BLUE SHIELD

## 2019-12-28 ENCOUNTER — Encounter: Payer: Self-pay | Admitting: Podiatry

## 2019-12-28 DIAGNOSIS — M722 Plantar fascial fibromatosis: Secondary | ICD-10-CM | POA: Diagnosis not present

## 2019-12-28 MED ORDER — MELOXICAM 15 MG PO TABS: 15.0000 mg | ORAL_TABLET | Freq: Every day | ORAL | 3 refills | Status: DC

## 2019-12-28 MED ORDER — METHYLPREDNISOLONE 4 MG PO TBPK: ORAL_TABLET | ORAL | 0 refills | Status: DC

## 2019-12-28 NOTE — Progress Notes (Signed)
Subjective:  Patient ID: Kathy Williamson, female    DOB: 11/20/1985,  MRN: [id number] HPI Chief Complaint  Patient presents with  . Foot Pain    Plantar and poserior heel bilaeral (L>R) - aching, throbbing, hot sensations x 3 weeks, thinks arches have fallen, AM pain, increased activity recently, Ibuprofen PRN  . New Patient (Initial Visit)    34 y.o. female presents with the above complaint.   ROS: Denies fever chills nausea vomiting muscle aches pains calf pain back pain chest pain shortness of breath.  Past Medical History:  Diagnosis Date  . H/O shoulder dystocia in prior pregnancy, currently pregnant    G1  . Infertility management   . PCOS (polycystic ovarian syndrome)   . Pre-diabetes   . Pruritic urticarial papules and plaques of pregnancy    with G1  . Third degree perineal laceration    G1  . Umbilical hernia 08/2018   can follow per Dr. Lemar Livings   Past Surgical History:  Procedure Laterality Date  . COLPOSCOPY N/A 2012  . KNEE SURGERY Right 2013  . SHOULDER SURGERY Left 2009  . TONSILLECTOMY AND ADENOIDECTOMY      Current Outpatient Medications:  .  escitalopram (LEXAPRO) 10 MG tablet, Take 1 tablet (10 mg total) by mouth daily. (Patient not taking: Reported on 12/08/2019), Disp: 90 tablet, Rfl: 0 .  meloxicam (MOBIC) 15 MG tablet, Take 1 tablet (15 mg total) by mouth daily., Disp: 30 tablet, Rfl: 3 .  metFORMIN (GLUCOPHAGE-XR) 500 MG 24 hr tablet, TAKE 1 TABLET BY MOUTH EVERY DAY WITH BREAKFAST, Disp: 90 tablet, Rfl: 3 .  methylPREDNISolone (MEDROL DOSEPAK) 4 MG TBPK tablet, 6 day dose pack - take as directed, Disp: 21 tablet, Rfl: 0 .  progesterone (PROMETRIUM) 200 MG capsule, TAKE 1 CAPSULE BY MOUTH EVERY DAY, Disp: 90 capsule, Rfl: 0 .  XIIDRA 5 % SOLN, , Disp: , Rfl:   Allergies  Allergen Reactions  . Azithromycin Hives   Review of Systems Objective:  There were no vitals filed for this visit.  General: Well developed, nourished, in no acute distress,  alert and oriented x3   Dermatological: Skin is warm, dry and supple bilateral. Nails x 10 are well maintained; remaining integument appears unremarkable at this time. There are no open sores, no preulcerative lesions, no rash or signs of infection present.  Vascular: Dorsalis Pedis artery and Posterior Tibial artery pedal pulses are 2/4 bilateral with immedate capillary fill time. Pedal hair growth present. No varicosities and no lower extremity edema present bilateral.   Neruologic: Grossly intact via light touch bilateral. Vibratory intact via tuning fork bilateral. Protective threshold with Semmes Wienstein monofilament intact to all pedal sites bilateral. Patellar and Achilles deep tendon reflexes 2+ bilateral. No Babinski or clonus noted bilateral.   Musculoskeletal: No gross boney pedal deformities bilateral. No pain, crepitus, or limitation noted with foot and ankle range of motion bilateral. Muscular strength 5/5 in all groups tested bilateral.  She has severe pain on palpation medial calcaneal tubercles bilaterally.  No pain on palpation of the Achilles bilateral.  Gait: Unassisted, Nonantalgic.    Radiographs:  Radiographs taken today demonstrate osseously mature individual soft tissue increase in density plantar fascial kidney insertion sites Achilles look normal mild retro and plantar calcaneal heel spurs minimal.  No acute findings noted.  Assessment & Plan:   Assessment: Chronic intractable plantar fasciitis right greater than left.  Plan: Discussed etiology pathology conservative versus surgical therapies start her on Medrol Dosepak to be  followed by meloxicam.  Placed her in a plantar fascial brace and night splints bilaterally.  Injected her bilateral heels today 20 mg Kenalog 5 mg Marcaine tolerated procedure well I will follow-up with her in 1 month.  We did discuss appropriate shoe gears stretching exercises ice therapy and shoe gear modifications.     Eleanore Junio T. [place],  [place]

## 2020-01-09 ENCOUNTER — Ambulatory Visit: Payer: BLUE CROSS/BLUE SHIELD | Admitting: Obstetrics and Gynecology

## 2020-01-20 ENCOUNTER — Other Ambulatory Visit: Payer: Self-pay

## 2020-01-20 ENCOUNTER — Ambulatory Visit (INDEPENDENT_AMBULATORY_CARE_PROVIDER_SITE_OTHER): Payer: BLUE CROSS/BLUE SHIELD | Admitting: Podiatry

## 2020-01-20 DIAGNOSIS — M722 Plantar fascial fibromatosis: Secondary | ICD-10-CM

## 2020-01-20 NOTE — Progress Notes (Signed)
   Subjective: 34 y.o. female presenting for follow-up evaluation of plantar fasciitis to the bilateral feet.  Patient states that there is some modest improvement.  She received anti-inflammatory steroid injections last visit on 12/28/2019.  She is also been taking the meloxicam as directed.  She also purchased a new pair of new balance shoes which have helped as well.  No new complaints at this time   Past Medical History:  Diagnosis Date  . H/O shoulder dystocia in prior pregnancy, currently pregnant    G1  . Infertility management   . PCOS (polycystic ovarian syndrome)   . Pre-diabetes   . Pruritic urticarial papules and plaques of pregnancy    with G1  . Third degree perineal laceration    G1  . Umbilical hernia 08/2018   can follow per Dr. Lemar Livings     Objective: Physical Exam General: The patient is alert and oriented x3 in no acute distress.  Dermatology: Skin is warm, dry and supple bilateral lower extremities. Negative for open lesions or macerations bilateral.   Vascular: Dorsalis Pedis and Posterior Tibial pulses palpable bilateral.  Capillary fill time is immediate to all digits.  Neurological: Epicritic and protective threshold intact bilateral.   Musculoskeletal: Tenderness to palpation to the plantar aspect of the bilateral heels along the plantar fascia. All other joints range of motion within normal limits bilateral. Strength 5/5 in all groups bilateral.   Assessment: 1. plantar fasciitis bilateral feet  Plan of Care:  1. Patient evaluated.    2. Injection of 0.5cc Celestone soluspan injected into the bilateral heels.  3.  Continue meloxicam as needed  4.  Discontinue plantar fascial braces and orthotics from the chiropractor since they cause irritation and discomfort 5.continue wearing good supportive new balance sneakers 6. Instructed patient regarding therapies and modalities at home to alleviate symptoms.  7. Return to clinic as needed.  Patient is  moving to the [place] area. Recommend Dr. Orlinda Blalock [facility] for continued care  Felecia Shelling, DPM [facility]  Dr. Felecia Shelling, DPM    [address] [place], [place] [address]                Office [phone]  Fax [phone]

## 2020-02-01 ENCOUNTER — Encounter: Payer: BLUE CROSS/BLUE SHIELD | Admitting: Podiatry

## 2020-02-09 DIAGNOSIS — M722 Plantar fascial fibromatosis: Secondary | ICD-10-CM | POA: Diagnosis not present

## 2020-02-09 DIAGNOSIS — M76821 Posterior tibial tendinitis, right leg: Secondary | ICD-10-CM | POA: Diagnosis not present

## 2020-02-09 DIAGNOSIS — M79671 Pain in right foot: Secondary | ICD-10-CM | POA: Diagnosis not present

## 2020-02-09 DIAGNOSIS — M76822 Posterior tibial tendinitis, left leg: Secondary | ICD-10-CM | POA: Diagnosis not present

## 2020-02-16 DIAGNOSIS — M76822 Posterior tibial tendinitis, left leg: Secondary | ICD-10-CM | POA: Diagnosis not present

## 2020-02-16 DIAGNOSIS — M76821 Posterior tibial tendinitis, right leg: Secondary | ICD-10-CM | POA: Diagnosis not present

## 2020-02-16 DIAGNOSIS — M722 Plantar fascial fibromatosis: Secondary | ICD-10-CM | POA: Diagnosis not present

## 2020-02-16 DIAGNOSIS — M24572 Contracture, left ankle: Secondary | ICD-10-CM | POA: Diagnosis not present

## 2020-02-16 DIAGNOSIS — M24571 Contracture, right ankle: Secondary | ICD-10-CM | POA: Diagnosis not present

## 2020-02-20 ENCOUNTER — Encounter: Payer: BLUE CROSS/BLUE SHIELD | Admitting: Podiatry

## 2020-02-29 DIAGNOSIS — M7661 Achilles tendinitis, right leg: Secondary | ICD-10-CM | POA: Diagnosis not present

## 2020-02-29 DIAGNOSIS — M24572 Contracture, left ankle: Secondary | ICD-10-CM | POA: Diagnosis not present

## 2020-02-29 DIAGNOSIS — M7662 Achilles tendinitis, left leg: Secondary | ICD-10-CM | POA: Diagnosis not present

## 2020-02-29 DIAGNOSIS — M722 Plantar fascial fibromatosis: Secondary | ICD-10-CM | POA: Diagnosis not present

## 2020-02-29 DIAGNOSIS — M24571 Contracture, right ankle: Secondary | ICD-10-CM | POA: Diagnosis not present

## 2020-03-13 ENCOUNTER — Other Ambulatory Visit: Payer: Self-pay | Admitting: Obstetrics and Gynecology

## 2020-03-13 DIAGNOSIS — E282 Polycystic ovarian syndrome: Secondary | ICD-10-CM

## 2020-04-04 DIAGNOSIS — M24572 Contracture, left ankle: Secondary | ICD-10-CM | POA: Diagnosis not present

## 2020-04-04 DIAGNOSIS — M24571 Contracture, right ankle: Secondary | ICD-10-CM | POA: Diagnosis not present

## 2020-04-04 DIAGNOSIS — M722 Plantar fascial fibromatosis: Secondary | ICD-10-CM | POA: Diagnosis not present

## 2020-04-04 DIAGNOSIS — M7662 Achilles tendinitis, left leg: Secondary | ICD-10-CM | POA: Diagnosis not present

## 2020-04-04 DIAGNOSIS — M7661 Achilles tendinitis, right leg: Secondary | ICD-10-CM | POA: Diagnosis not present

## 2020-04-11 DIAGNOSIS — M545 Low back pain, unspecified: Secondary | ICD-10-CM | POA: Diagnosis not present

## 2020-04-11 DIAGNOSIS — S39013A Strain of muscle, fascia and tendon of pelvis, initial encounter: Secondary | ICD-10-CM | POA: Diagnosis not present

## 2020-04-11 DIAGNOSIS — M9903 Segmental and somatic dysfunction of lumbar region: Secondary | ICD-10-CM | POA: Diagnosis not present

## 2020-04-11 DIAGNOSIS — M62830 Muscle spasm of back: Secondary | ICD-10-CM | POA: Diagnosis not present

## 2020-04-25 DIAGNOSIS — M545 Low back pain, unspecified: Secondary | ICD-10-CM | POA: Diagnosis not present

## 2020-04-25 DIAGNOSIS — M62830 Muscle spasm of back: Secondary | ICD-10-CM | POA: Diagnosis not present

## 2020-04-25 DIAGNOSIS — M9903 Segmental and somatic dysfunction of lumbar region: Secondary | ICD-10-CM | POA: Diagnosis not present

## 2020-04-25 DIAGNOSIS — S39013A Strain of muscle, fascia and tendon of pelvis, initial encounter: Secondary | ICD-10-CM | POA: Diagnosis not present

## 2020-05-10 DIAGNOSIS — M24571 Contracture, right ankle: Secondary | ICD-10-CM | POA: Diagnosis not present

## 2020-05-10 DIAGNOSIS — M24572 Contracture, left ankle: Secondary | ICD-10-CM | POA: Diagnosis not present

## 2020-05-10 DIAGNOSIS — M722 Plantar fascial fibromatosis: Secondary | ICD-10-CM | POA: Diagnosis not present

## 2020-05-10 DIAGNOSIS — M7661 Achilles tendinitis, right leg: Secondary | ICD-10-CM | POA: Diagnosis not present

## 2020-05-10 DIAGNOSIS — M7662 Achilles tendinitis, left leg: Secondary | ICD-10-CM | POA: Diagnosis not present

## 2020-05-14 ENCOUNTER — Other Ambulatory Visit: Payer: Self-pay

## 2020-05-14 ENCOUNTER — Encounter: Payer: Self-pay | Admitting: Obstetrics and Gynecology

## 2020-05-14 ENCOUNTER — Other Ambulatory Visit: Payer: Self-pay | Admitting: Obstetrics and Gynecology

## 2020-05-14 DIAGNOSIS — E282 Polycystic ovarian syndrome: Secondary | ICD-10-CM

## 2020-05-14 MED ORDER — PROGESTERONE 200 MG PO CAPS: ORAL_CAPSULE | ORAL | 1 refills | Status: DC

## 2020-05-14 NOTE — Telephone Encounter (Signed)
Rx cancelled at local. Call pt to confirm new pharmacy. It is [facility] not [place]. Rx eRxd. Pt aware.

## 2020-05-14 NOTE — Progress Notes (Signed)
Rx RF prog, doing well cyclically for cycle control.

## 2020-05-14 NOTE — Telephone Encounter (Signed)
[facility]s [facility] [place] I can't find them on eRx. Thx.

## 2020-05-24 DIAGNOSIS — F411 Generalized anxiety disorder: Secondary | ICD-10-CM | POA: Diagnosis not present

## 2020-07-03 ENCOUNTER — Telehealth: Payer: Self-pay

## 2020-07-03 NOTE — Telephone Encounter (Signed)
Pt said she can drive in to town to get it done here. She is having trouble finding an OB/GYN (appts are so far out).

## 2020-07-03 NOTE — Telephone Encounter (Signed)
She moved to [place]. Does she want u/s down there? If so, does she have a preference as to where she wants to go or [place] just do ref?

## 2020-07-03 NOTE — Telephone Encounter (Signed)
I see patient is due for annual. But I need an order to faxed over for [facility] to contact patient for scheduled for ultrasound . Please advise

## 2020-07-03 NOTE — Telephone Encounter (Signed)
Called pt, no answer, LVMTRC. 

## 2020-07-03 NOTE — Telephone Encounter (Addendum)
LM for pt. As long as AUB resolved and no pelvic pain, f/u u/s no longer indicated for <5.0 cm simple RTO cyst. Pt to call back to f/u with sx

## 2020-07-23 DIAGNOSIS — M6208 Separation of muscle (nontraumatic), other site: Secondary | ICD-10-CM | POA: Diagnosis not present

## 2020-07-23 DIAGNOSIS — E282 Polycystic ovarian syndrome: Secondary | ICD-10-CM | POA: Diagnosis not present

## 2020-07-27 DIAGNOSIS — M6208 Separation of muscle (nontraumatic), other site: Secondary | ICD-10-CM | POA: Diagnosis not present

## 2020-07-27 DIAGNOSIS — R531 Weakness: Secondary | ICD-10-CM | POA: Diagnosis not present

## 2020-07-27 DIAGNOSIS — M545 Low back pain, unspecified: Secondary | ICD-10-CM | POA: Diagnosis not present

## 2020-07-30 DIAGNOSIS — R531 Weakness: Secondary | ICD-10-CM | POA: Diagnosis not present

## 2020-07-30 DIAGNOSIS — M6208 Separation of muscle (nontraumatic), other site: Secondary | ICD-10-CM | POA: Diagnosis not present

## 2020-07-30 DIAGNOSIS — M545 Low back pain, unspecified: Secondary | ICD-10-CM | POA: Diagnosis not present

## 2020-08-03 DIAGNOSIS — R531 Weakness: Secondary | ICD-10-CM | POA: Diagnosis not present

## 2020-08-03 DIAGNOSIS — M545 Low back pain, unspecified: Secondary | ICD-10-CM | POA: Diagnosis not present

## 2020-08-03 DIAGNOSIS — M6208 Separation of muscle (nontraumatic), other site: Secondary | ICD-10-CM | POA: Diagnosis not present

## 2020-08-08 DIAGNOSIS — M6208 Separation of muscle (nontraumatic), other site: Secondary | ICD-10-CM | POA: Diagnosis not present

## 2020-08-08 DIAGNOSIS — R531 Weakness: Secondary | ICD-10-CM | POA: Diagnosis not present

## 2020-08-08 DIAGNOSIS — M545 Low back pain, unspecified: Secondary | ICD-10-CM | POA: Diagnosis not present

## 2020-08-16 DIAGNOSIS — M545 Low back pain, unspecified: Secondary | ICD-10-CM | POA: Diagnosis not present

## 2020-08-16 DIAGNOSIS — M6208 Separation of muscle (nontraumatic), other site: Secondary | ICD-10-CM | POA: Diagnosis not present

## 2020-08-16 DIAGNOSIS — R531 Weakness: Secondary | ICD-10-CM | POA: Diagnosis not present

## 2020-08-24 DIAGNOSIS — M545 Low back pain, unspecified: Secondary | ICD-10-CM | POA: Diagnosis not present

## 2020-08-24 DIAGNOSIS — R531 Weakness: Secondary | ICD-10-CM | POA: Diagnosis not present

## 2020-08-24 DIAGNOSIS — M6208 Separation of muscle (nontraumatic), other site: Secondary | ICD-10-CM | POA: Diagnosis not present

## 2020-08-29 DIAGNOSIS — M7662 Achilles tendinitis, left leg: Secondary | ICD-10-CM | POA: Diagnosis not present

## 2020-08-29 DIAGNOSIS — M7661 Achilles tendinitis, right leg: Secondary | ICD-10-CM | POA: Diagnosis not present

## 2020-08-29 DIAGNOSIS — M76821 Posterior tibial tendinitis, right leg: Secondary | ICD-10-CM | POA: Diagnosis not present

## 2020-08-29 DIAGNOSIS — M722 Plantar fascial fibromatosis: Secondary | ICD-10-CM | POA: Diagnosis not present

## 2020-11-01 DIAGNOSIS — M545 Low back pain, unspecified: Secondary | ICD-10-CM | POA: Diagnosis not present

## 2020-11-01 DIAGNOSIS — M79671 Pain in right foot: Secondary | ICD-10-CM | POA: Diagnosis not present

## 2020-11-01 DIAGNOSIS — R202 Paresthesia of skin: Secondary | ICD-10-CM | POA: Diagnosis not present

## 2020-11-01 DIAGNOSIS — M79672 Pain in left foot: Secondary | ICD-10-CM | POA: Diagnosis not present

## 2020-11-07 DIAGNOSIS — G5752 Tarsal tunnel syndrome, left lower limb: Secondary | ICD-10-CM | POA: Diagnosis not present

## 2020-11-07 DIAGNOSIS — M79671 Pain in right foot: Secondary | ICD-10-CM | POA: Diagnosis not present

## 2020-11-15 DIAGNOSIS — M722 Plantar fascial fibromatosis: Secondary | ICD-10-CM | POA: Diagnosis not present

## 2020-11-15 DIAGNOSIS — G5751 Tarsal tunnel syndrome, right lower limb: Secondary | ICD-10-CM | POA: Diagnosis not present

## 2020-11-15 DIAGNOSIS — M76821 Posterior tibial tendinitis, right leg: Secondary | ICD-10-CM | POA: Diagnosis not present

## 2020-11-15 DIAGNOSIS — M76822 Posterior tibial tendinitis, left leg: Secondary | ICD-10-CM | POA: Diagnosis not present

## 2020-12-11 DIAGNOSIS — M722 Plantar fascial fibromatosis: Secondary | ICD-10-CM | POA: Diagnosis not present

## 2020-12-11 DIAGNOSIS — M79671 Pain in right foot: Secondary | ICD-10-CM | POA: Diagnosis not present

## 2020-12-11 DIAGNOSIS — M79672 Pain in left foot: Secondary | ICD-10-CM | POA: Diagnosis not present

## 2020-12-11 DIAGNOSIS — G5753 Tarsal tunnel syndrome, bilateral lower limbs: Secondary | ICD-10-CM | POA: Diagnosis not present

## 2020-12-19 DIAGNOSIS — M25872 Other specified joint disorders, left ankle and foot: Secondary | ICD-10-CM | POA: Diagnosis not present

## 2020-12-19 DIAGNOSIS — G5752 Tarsal tunnel syndrome, left lower limb: Secondary | ICD-10-CM | POA: Diagnosis not present

## 2020-12-19 DIAGNOSIS — M65872 Other synovitis and tenosynovitis, left ankle and foot: Secondary | ICD-10-CM | POA: Diagnosis not present

## 2020-12-31 DIAGNOSIS — M216X1 Other acquired deformities of right foot: Secondary | ICD-10-CM | POA: Diagnosis not present

## 2020-12-31 DIAGNOSIS — M216X2 Other acquired deformities of left foot: Secondary | ICD-10-CM | POA: Diagnosis not present

## 2020-12-31 DIAGNOSIS — G5753 Tarsal tunnel syndrome, bilateral lower limbs: Secondary | ICD-10-CM | POA: Diagnosis not present

## 2020-12-31 DIAGNOSIS — M722 Plantar fascial fibromatosis: Secondary | ICD-10-CM | POA: Diagnosis not present

## 2021-02-11 DIAGNOSIS — F32A Depression, unspecified: Secondary | ICD-10-CM | POA: Diagnosis not present

## 2021-02-11 DIAGNOSIS — F419 Anxiety disorder, unspecified: Secondary | ICD-10-CM | POA: Diagnosis not present

## 2021-02-11 DIAGNOSIS — E282 Polycystic ovarian syndrome: Secondary | ICD-10-CM | POA: Diagnosis not present

## 2021-08-20 ENCOUNTER — Telehealth: Payer: Self-pay

## 2021-08-20 DIAGNOSIS — E282 Polycystic ovarian syndrome: Secondary | ICD-10-CM

## 2021-08-20 MED ORDER — PROGESTERONE 200 MG PO CAPS: ORAL_CAPSULE | ORAL | 1 refills | Status: DC

## 2021-08-20 NOTE — Telephone Encounter (Signed)
Pt calling; has moved back to the area from [place] and is trying to get reestablished with [facility]; Annual scheduled c [facility] in Aug '23 but needs refill of prometrium 200mg  caps really soon.  [phone]  [facility] [address] would be sent there.

## 2021-10-30 NOTE — Progress Notes (Unsigned)
No chief complaint on file.   HPI:      [name] is a 36 y.o. G1P1001 who LMP was No LMP recorded., presents today for her annual examination.  Her menses are now every 2-3 wks (after stopping breastfeeding last yr), usually 6 days, some light flow, some mod flow. With dysmen, improved with NSAIDs. Had normal TSH/prolactin 2/21. Hx of PCOS, takes prometrium 200 mg daily, as well as metformin. This used to regulate her cycles, although she has had to take prometrium cyclically for cycle control in past. Pt prefers daily prog due to sleep issues and feels better with it.   Sex activity: single partner--no dyspareunia; husband has extra skin covering his penis affecting function; surgery is remedy and possibility early 2022  Last Pap: 08/07/17  Results were: no abnormalities /neg HPV DNA  Hx of STDs: none  There is a FH of breast cancer in her mat grt aunt, genetic testing not indicated. There is no FH of ovarian cancer. The patient does do self-breast exams.  Tobacco use: The patient denies current or previous tobacco use. Alcohol use: none  Drug use: none Exercise: very active  She does get adequate calcium and Vitamin D in her diet. Hx of pre-DM, taking metformin. Did labs 2/21, HgA1C=5.7. Will cont metformin  Has umbilical hernia and diastasis recti. Saw Dr. Lemar Livings 6/20, will follow expectantly for now.  Started lexapro 2/21 for anxiety/depression sx with sx improvement. Some stressors have resolved and pt has weaned off lexapro. Doing ok for now. Will call for Rx RF prn.    Past Medical History:  Diagnosis Date   H/O shoulder dystocia in prior pregnancy, currently pregnant    G1   Infertility management    PCOS (polycystic ovarian syndrome)    Pre-diabetes    Pruritic urticarial papules and plaques of pregnancy    with G1   Third degree perineal laceration    G1   Umbilical hernia 08/2018   can follow per Dr. Lemar Livings    Past Surgical History:  Procedure  Laterality Date   COLPOSCOPY N/A 2012   KNEE SURGERY Right 2013   SHOULDER SURGERY Left 2009   TONSILLECTOMY AND ADENOIDECTOMY      Family History  Problem Relation Age of Onset   Diabetes Mother    Diabetes Maternal Grandmother    Arthritis Maternal Grandmother    Stroke Maternal Grandmother    Kidney failure Maternal Grandmother    Hypertension Maternal Grandmother    Heart disease Maternal Grandfather    Kidney failure Maternal Grandfather    Cardiomyopathy Paternal Grandmother    Cardiomyopathy Paternal Grandfather    Breast cancer Other      ROS: Review of Systems  Constitutional:  Positive for fatigue. Negative for fever and unexpected weight change.  Respiratory:  Negative for cough, shortness of breath and wheezing.   Cardiovascular:  Negative for chest pain, palpitations and leg swelling.  Gastrointestinal:  Negative for blood in stool, constipation, diarrhea, nausea and vomiting.  Endocrine: Negative for cold intolerance, heat intolerance and polyuria.  Genitourinary:  Positive for menstrual problem. Negative for dyspareunia, dysuria, flank pain, frequency, genital sores, hematuria, pelvic pain, urgency, vaginal bleeding, vaginal discharge and vaginal pain.  Musculoskeletal:  Negative for back pain, joint swelling and myalgias.  Skin:  Negative for rash.  Neurological:  Negative for dizziness, syncope, light-headedness, numbness and headaches.  Hematological:  Negative for adenopathy.  Psychiatric/Behavioral:  Negative for agitation, confusion, sleep disturbance and suicidal ideas. The  patient is not nervous/anxious.     Objective: There were no vitals taken for this visit.   Physical Exam Constitutional:      Appearance: She is well-developed.  Genitourinary:     Vulva normal.     No vaginal discharge, erythema or tenderness.      Right Adnexa: not tender and no mass present.    Left Adnexa: not tender and no mass present.    No cervical motion tenderness  or polyp.     Uterus is not enlarged or tender.  Breasts:    Right: No mass, nipple discharge, skin change or tenderness.     Left: No mass, nipple discharge, skin change or tenderness.  Neck:     Thyroid: No thyromegaly.  Cardiovascular:     Rate and Rhythm: Normal rate and regular rhythm.     Heart sounds: Normal heart sounds. No murmur heard. Pulmonary:     Effort: Pulmonary effort is normal.     Breath sounds: Normal breath sounds.  Abdominal:     Palpations: Abdomen is soft.     Tenderness: There is no abdominal tenderness. There is no guarding.  Musculoskeletal:        General: Normal range of motion.     Cervical back: Normal range of motion.  Neurological:     General: No focal deficit present.     Mental Status: She is alert and oriented to person, place, and time.     Cranial Nerves: No cranial nerve deficit.  Skin:    General: Skin is warm and dry.  Psychiatric:        Mood and Affect: Mood normal.        Behavior: Behavior normal.        Thought Content: Thought content normal.        Judgment: Judgment normal.  Vitals reviewed.     Assessment/Plan: Encounter for annual routine gynecological examination  Abnormal uterine bleeding (AUB) - Plan: US PELVIS TRANSVAGINAL NON-OB (TV ONLY); Check GYN u/s. Will f/u with results. If neg, will try cyclic prog instead of daily.   PCOS (polycystic ovarian syndrome)  Pre-diabetes - Plan: metFORMIN (GLUCOPHAGE-XR) 500 MG 24 hr tablet; Rx RF eRxd. Pt doing intense cardio/wt lifting classes now  Anxiety and depression--improved, d/c'd lexapro. F/u prn Rx RF.   No orders of the defined types were placed in this encounter.              GYN counsel adequate intake of calcium and vitamin D, diet and exercise     F/U  No follow-ups on file.  Yanice Maqueda B. Una Yeomans, PA-C 10/30/2021 4:02 PM

## 2021-10-31 ENCOUNTER — Ambulatory Visit (INDEPENDENT_AMBULATORY_CARE_PROVIDER_SITE_OTHER): Payer: BC Managed Care – PPO | Admitting: Obstetrics and Gynecology

## 2021-10-31 ENCOUNTER — Other Ambulatory Visit (HOSPITAL_COMMUNITY)
Admission: RE | Admit: 2021-10-31 | Discharge: 2021-10-31 | Disposition: A | Discharge status: Home / Self Care | Payer: BLUE CROSS/BLUE SHIELD | Source: Ambulatory Visit | Attending: Obstetrics and Gynecology | Admitting: Obstetrics and Gynecology

## 2021-10-31 ENCOUNTER — Encounter: Payer: Self-pay | Admitting: Obstetrics and Gynecology

## 2021-10-31 VITALS — BP 110/70 | Ht 67.0 in | Wt 208.0 lb

## 2021-10-31 DIAGNOSIS — E282 Polycystic ovarian syndrome: Secondary | ICD-10-CM

## 2021-10-31 DIAGNOSIS — Z1329 Encounter for screening for other suspected endocrine disorder: Secondary | ICD-10-CM | POA: Diagnosis not present

## 2021-10-31 DIAGNOSIS — N920 Excessive and frequent menstruation with regular cycle: Secondary | ICD-10-CM

## 2021-10-31 DIAGNOSIS — Z1151 Encounter for screening for human papillomavirus (HPV): Secondary | ICD-10-CM | POA: Insufficient documentation

## 2021-10-31 DIAGNOSIS — Z124 Encounter for screening for malignant neoplasm of cervix: Secondary | ICD-10-CM

## 2021-10-31 DIAGNOSIS — Z30011 Encounter for initial prescription of contraceptive pills: Secondary | ICD-10-CM

## 2021-10-31 DIAGNOSIS — Z131 Encounter for screening for diabetes mellitus: Secondary | ICD-10-CM

## 2021-10-31 DIAGNOSIS — Z01419 Encounter for gynecological examination (general) (routine) without abnormal findings: Secondary | ICD-10-CM

## 2021-10-31 DIAGNOSIS — R7303 Prediabetes: Secondary | ICD-10-CM

## 2021-10-31 DIAGNOSIS — Z0000 Encounter for general adult medical examination without abnormal findings: Secondary | ICD-10-CM | POA: Diagnosis not present

## 2021-10-31 DIAGNOSIS — F419 Anxiety disorder, unspecified: Secondary | ICD-10-CM

## 2021-10-31 MED ORDER — DROSPIRENONE-ETHINYL ESTRADIOL 3-0.02 MG PO TABS: 1.0000 | ORAL_TABLET | Freq: Every day | ORAL | 3 refills | Status: DC

## 2021-10-31 NOTE — Patient Instructions (Signed)
I value your feedback and you entrusting us with your care. If you get a [facility] patient survey, I would appreciate you taking the time to let us know about your experience today. Thank you! ? ? ?

## 2021-11-01 LAB — COMPREHENSIVE METABOLIC PANEL
ALT: 12 IU/L (ref 0–32)
AST: 14 IU/L (ref 0–40)
Albumin/Globulin Ratio: 1.9 (ref 1.2–2.2)
Albumin: 4.4 g/dL (ref 3.9–4.9)
Alkaline Phosphatase: 57 IU/L (ref 44–121)
BUN/Creatinine Ratio: 22 (ref 9–23)
BUN: 15 mg/dL (ref 6–20)
Bilirubin Total: 0.5 mg/dL (ref 0.0–1.2)
CO2: 21 mmol/L (ref 20–29)
Calcium: 8.9 mg/dL (ref 8.7–10.2)
Chloride: 104 mmol/L (ref 96–106)
Creatinine, Ser: 0.67 mg/dL (ref 0.57–1.00)
Globulin, Total: 2.3 g/dL (ref 1.5–4.5)
Glucose: 64 mg/dL — ABNORMAL LOW (ref 70–99)
Potassium: 4.1 mmol/L (ref 3.5–5.2)
Sodium: 141 mmol/L (ref 134–144)
Total Protein: 6.7 g/dL (ref 6.0–8.5)
eGFR: 115 mL/min/{1.73_m2} (ref >59)

## 2021-11-01 LAB — CBC WITH DIFFERENTIAL/PLATELET
Basophils Absolute: 0.1 10*3/uL (ref 0.0–0.2)
Basos: 1 %
EOS (ABSOLUTE): 0.1 10*3/uL (ref 0.0–0.4)
Eos: 2 %
Hematocrit: 40.1 % (ref 34.0–46.6)
Hemoglobin: 13.1 g/dL (ref 11.1–15.9)
Immature Grans (Abs): 0 10*3/uL (ref 0.0–0.1)
Immature Granulocytes: 0 %
Lymphocytes Absolute: 1.8 10*3/uL (ref 0.7–3.1)
Lymphs: 31 %
MCH: 28.5 pg (ref 26.6–33.0)
MCHC: 32.7 g/dL (ref 31.5–35.7)
MCV: 87 fL (ref 79–97)
Monocytes Absolute: 0.5 10*3/uL (ref 0.1–0.9)
Monocytes: 9 %
Neutrophils Absolute: 3.3 10*3/uL (ref 1.4–7.0)
Neutrophils: 57 %
Platelets: 294 10*3/uL (ref 150–450)
RBC: 4.6 x10E6/uL (ref 3.77–5.28)
RDW: 12.1 % (ref 11.7–15.4)
WBC: 5.8 10*3/uL (ref 3.4–10.8)

## 2021-11-01 LAB — T4, FREE: Free T4: 1.05 ng/dL (ref 0.82–1.77)

## 2021-11-01 LAB — HEMOGLOBIN A1C
Est. average glucose Bld gHb Est-mCnc: 114 mg/dL
Hgb A1c MFr Bld: 5.6 % (ref 4.8–5.6)

## 2021-11-01 LAB — CYTOLOGY - PAP
Comment: NEGATIVE
Diagnosis: NEGATIVE
High risk HPV: NEGATIVE

## 2021-11-01 LAB — TSH: TSH: 1.72 u[IU]/mL (ref 0.450–4.500)

## 2021-11-04 ENCOUNTER — Ambulatory Visit
Admission: RE | Admit: 2021-11-04 | Discharge: 2021-11-04 | Disposition: A | Discharge status: Home / Self Care | Payer: BC Managed Care – PPO | Source: Ambulatory Visit | Attending: Family Medicine | Admitting: Family Medicine

## 2021-11-04 VITALS — BP 117/81 | HR 80 | Temp 98.9°F | Resp 18

## 2021-11-04 DIAGNOSIS — J069 Acute upper respiratory infection, unspecified: Secondary | ICD-10-CM | POA: Diagnosis not present

## 2021-11-04 DIAGNOSIS — J209 Acute bronchitis, unspecified: Secondary | ICD-10-CM

## 2021-11-04 MED ORDER — ALBUTEROL SULFATE HFA 108 (90 BASE) MCG/ACT IN AERS
2.0000 | INHALATION_SPRAY | RESPIRATORY_TRACT | 0 refills | Status: DC | PRN
Start: 2021-11-04 — End: 2022-08-26

## 2021-11-04 MED ORDER — PROMETHAZINE-DM 6.25-15 MG/5ML PO SYRP: 5.0000 mL | ORAL_SOLUTION | Freq: Four times a day (QID) | ORAL | 0 refills | Status: DC | PRN

## 2021-11-04 MED ORDER — PREDNISONE 20 MG PO TABS: 40.0000 mg | ORAL_TABLET | Freq: Every day | ORAL | 0 refills | Status: DC

## 2021-11-04 MED ORDER — ALBUTEROL SULFATE HFA 108 (90 BASE) MCG/ACT IN AERS
2.0000 | INHALATION_SPRAY | Freq: Once | RESPIRATORY_TRACT | Status: AC
Start: 2021-11-04 — End: 2021-11-04
  Administered 2021-11-04: 2 via RESPIRATORY_TRACT

## 2021-11-04 NOTE — Discharge Instructions (Addendum)
Recommend COVID testing if symptoms do not readily improve.

## 2021-11-04 NOTE — ED Triage Notes (Signed)
Patient presents to Urgent Care with complaints of body aches, chills, bilateral ear pain, and sore throat since Friday. Last known fever on Saturday. Taking mucinex. Concerned with bronchitis, states she had it back in dec 2022.

## 2021-11-04 NOTE — ED Provider Notes (Signed)
Renaldo Fiddler    CSN: [id number] Arrival date & time: 11/04/21  1254      History   Chief Complaint Chief Complaint  Patient presents with  . Nasal Congestion    Body aches, chills , extreme ear and throat pain . Concerned about bronchitis - Entered by patient  . Generalized Body Aches  . Otalgia  . Cough  . Sore Throat    HPI Kathy Williamson is a 36 y.o. female.   HPI  Patient presents with URI symptoms including cough, sore throat, otalgia, nasal congestion, runny nose, and sinus pressure. Unknown of COVID exposure. COVID Vaccinated: Y/N Flu vaccinated: Y/N Denies worrisome symptoms of shortness of breath, weakness, N&V, chest pain or leg pain.   Past Medical History:  Diagnosis Date  . H/O shoulder dystocia in prior pregnancy, currently pregnant    G1  . Infertility management   . PCOS (polycystic ovarian syndrome)   . Pre-diabetes   . Pruritic urticarial papules and plaques of pregnancy    with G1  . Third degree perineal laceration    G1  . Umbilical hernia 08/2018   can follow per Dr. Lemar Livings    Patient Active Problem List   Diagnosis Date Noted  . Anxiety and depression 12/08/2019  . Pre-diabetes 12/08/2019  . PMDD (premenstrual dysphoric disorder) 05/17/2019  . Rectus diastasis 08/31/2018  . Umbilical hernia without obstruction and without gangrene 08/31/2018  . Postpartum care following vaginal delivery 05/28/2017  . History of maternal third degree perineal laceration, currently pregnant 05/21/2017  . History of shoulder dystocia in prior pregnancy, currently pregnant in third trimester 05/21/2017  . Supervision of high risk pregnancy, antepartum 10/14/2016  . Obesity affecting pregnancy 10/14/2016  . BMI 30.0-30.9,adult 10/14/2016  . Infertility female 06/16/2016  . Rh negative state in antepartum period 05/28/2016    Past Surgical History:  Procedure Laterality Date  . COLPOSCOPY N/A 2012  . KNEE SURGERY Right 2013  . SHOULDER SURGERY Left  2009  . TONSILLECTOMY AND ADENOIDECTOMY      OB History     Gravida  2   Para  2   Term  2   Preterm      AB      Living  2      SAB      IAB      Ectopic      Multiple      Live Births  2            Home Medications    Prior to Admission medications   Medication Sig Start Date End Date Taking? Authorizing Provider  albuterol (VENTOLIN HFA) 108 (90 Base) MCG/ACT inhaler Inhale 2 puffs into the lungs every 4 (four) hours as needed for wheezing or shortness of breath. 11/04/21  Yes Bing Neighbors, FNP  predniSONE (DELTASONE) 20 MG tablet Take 2 tablets (40 mg total) by mouth daily with breakfast. 11/04/21  Yes Bing Neighbors, FNP  promethazine-dextromethorphan (PROMETHAZINE-DM) 6.25-15 MG/5ML syrup Take 5 mLs by mouth 4 (four) times daily as needed for cough. 11/04/21  Yes Bing Neighbors, FNP  drospirenone-ethinyl estradiol (YAZ) 3-0.02 MG tablet Take 1 tablet by mouth daily. 10/31/21   Copland, Ilona Sorrel, PA-C  meloxicam (MOBIC) 7.5 MG tablet TAKE 1 TABLET BY MOUTH EVERY DAY AFTER A MEAL Patient not taking: Reported on 10/31/2021 11/15/20   [provider]    Family History Family History  Problem Relation Age of Onset  . Diabetes Mother   .  Diabetes Maternal Grandmother   . Arthritis Maternal Grandmother   . Stroke Maternal Grandmother   . Kidney failure Maternal Grandmother   . Hypertension Maternal Grandmother   . Heart disease Maternal Grandfather   . Kidney failure Maternal Grandfather   . Cardiomyopathy Paternal Grandmother   . Cardiomyopathy Paternal Grandfather   . Breast cancer Other        80s    Social History Social History   Tobacco Use  . Smoking status: Never  . Smokeless tobacco: Never  Vaping Use  . Vaping Use: Never used  Substance Use Topics  . Alcohol use: No    Alcohol/week: 0.0 standard drinks of alcohol  . Drug use: No     Allergies   Azithromycin   Review of Systems Review of Systems   Physical  Exam Triage Vital Signs ED Triage Vitals  Enc Vitals Group     BP 11/04/21 1305 117/81     Pulse Rate 11/04/21 1305 80     Resp 11/04/21 1305 18     Temp 11/04/21 1305 98.9 F (37.2 C)     Temp src --      SpO2 11/04/21 1305 97 %     Weight --      Height --      Head Circumference --      Peak Flow --      Pain Score 11/04/21 1338 0     Pain Loc --      Pain Edu? --      Excl. in GC? --    No data found.  Updated Vital Signs BP 117/81   Pulse 80   Temp 98.9 F (37.2 C)   Resp 18   LMP 10/12/2021 (Exact Date)   SpO2 97%   Visual Acuity Right Eye Distance:   Left Eye Distance:   Bilateral Distance:    Right Eye Near:   Left Eye Near:    Bilateral Near:     Physical Exam   UC Treatments / Results  Labs (all labs ordered are listed, but only abnormal results are displayed) Labs Reviewed - No data to display  EKG   Radiology No results found.  Procedures Procedures (including critical care time)  Medications Ordered in UC Medications  albuterol (VENTOLIN HFA) 108 (90 Base) MCG/ACT inhaler 2 puff (2 puffs Inhalation Given 11/04/21 1331)    Initial Impression / Assessment and Plan / UC Course  I have reviewed the triage vital signs and the nursing notes.  Pertinent labs & imaging results that were available during my care of the patient were reviewed by me and considered in my medical decision making (see chart for details).     *** Final Clinical Impressions(s) / UC Diagnoses   Final diagnoses:  Acute bronchitis, unspecified organism  Acute URI     Discharge Instructions      Recommend COVID testing if symptoms do not readily improve.    ED Prescriptions     Medication Sig Dispense Auth. Provider   predniSONE (DELTASONE) 20 MG tablet Take 2 tablets (40 mg total) by mouth daily with breakfast. 10 tablet Bing Neighbors, FNP   promethazine-dextromethorphan (PROMETHAZINE-DM) 6.25-15 MG/5ML syrup Take 5 mLs by mouth 4 (four) times daily  as needed for cough. 180 mL Bing Neighbors, FNP   albuterol (VENTOLIN HFA) 108 (90 Base) MCG/ACT inhaler Inhale 2 puffs into the lungs every 4 (four) hours as needed for wheezing or shortness of breath. 1 each Tiburcio Pea,  Godfrey Pick, FNP      PDMP not reviewed this encounter.

## 2022-03-16 ENCOUNTER — Ambulatory Visit
Admission: RE | Admit: 2022-03-16 | Discharge: 2022-03-16 | Disposition: A | Discharge status: Home / Self Care | Payer: BC Managed Care – PPO | Source: Ambulatory Visit | Attending: Emergency Medicine | Admitting: Emergency Medicine

## 2022-03-16 VITALS — BP 112/77 | HR 81 | Temp 99.4°F | Resp 18 | Ht 67.0 in | Wt 200.0 lb

## 2022-03-16 DIAGNOSIS — J0100 Acute maxillary sinusitis, unspecified: Secondary | ICD-10-CM | POA: Diagnosis not present

## 2022-03-16 MED ORDER — AMOXICILLIN 875 MG PO TABS: 875.0000 mg | ORAL_TABLET | Freq: Two times a day (BID) | ORAL | 0 refills | Status: AC

## 2022-03-16 NOTE — ED Triage Notes (Signed)
Patient to Urgent Care with complaints of generalized body aches, cough, and ear pain. Reports sinus pain a pressure. Crackling in ears when blowing nose.   Symptoms started  Monday.   Max temp 99.6. Has been taking 12hr sudafed and mucinex.

## 2022-03-16 NOTE — ED Provider Notes (Signed)
Renaldo Fiddler    CSN: [id number] Arrival date & time: 03/16/22  1149      History   Chief Complaint Chief Complaint  Patient presents with   Nasal Congestion    Possible Sinus infection - Entered by patient    HPI Kathy Williamson is a 36 y.o. female.  Patient presents with 6-day history of bodyaches, earache, sinus pressure, sinus pain, cough.  No fever, shortness of breath, vomiting, diarrhea, or other symptoms.  No OTC medications taken today; previous treatment with Sudafed and Mucinex.  Patient was seen at this urgent care on 11/04/2021; diagnosed with bronchitis and URI; treated with albuterol inhaler, prednisone, Promethazine DM.  The history is provided by the patient and medical records.    Past Medical History:  Diagnosis Date   H/O shoulder dystocia in prior pregnancy, currently pregnant    G1   Infertility management    PCOS (polycystic ovarian syndrome)    Pre-diabetes    Pruritic urticarial papules and plaques of pregnancy    with G1   Third degree perineal laceration    G1   Umbilical hernia 08/2018   can follow per Dr. Lemar Livings    Patient Active Problem List   Diagnosis Date Noted   Anxiety and depression 12/08/2019   Pre-diabetes 12/08/2019   PMDD (premenstrual dysphoric disorder) 05/17/2019   Rectus diastasis 08/31/2018   Umbilical hernia without obstruction and without gangrene 08/31/2018   Postpartum care following vaginal delivery 05/28/2017   History of maternal third degree perineal laceration, currently pregnant 05/21/2017   History of shoulder dystocia in prior pregnancy, currently pregnant in third trimester 05/21/2017   Supervision of high risk pregnancy, antepartum 10/14/2016   Obesity affecting pregnancy 10/14/2016   BMI 30.0-30.9,adult 10/14/2016   Infertility female 06/16/2016   Rh negative state in antepartum period 05/28/2016    Past Surgical History:  Procedure Laterality Date   COLPOSCOPY N/A 2012   KNEE SURGERY Right 2013    SHOULDER SURGERY Left 2009   TONSILLECTOMY AND ADENOIDECTOMY      OB History     Gravida  2   Para  2   Term  2   Preterm      AB      Living  2      SAB      IAB      Ectopic      Multiple      Live Births  2            Home Medications    Prior to Admission medications   Medication Sig Start Date End Date Taking? Authorizing Provider  amoxicillin (AMOXIL) 875 MG tablet Take 1 tablet (875 mg total) by mouth 2 (two) times daily for 7 days. 03/16/22 03/23/22 Yes Mickie Bail, NP  albuterol (VENTOLIN HFA) 108 (90 Base) MCG/ACT inhaler Inhale 2 puffs into the lungs every 4 (four) hours as needed for wheezing or shortness of breath. 11/04/21   Bing Neighbors, FNP  drospirenone-ethinyl estradiol (YAZ) 3-0.02 MG tablet Take 1 tablet by mouth daily. 10/31/21   Copland, Ilona Sorrel, PA-C  meloxicam (MOBIC) 7.5 MG tablet TAKE 1 TABLET BY MOUTH EVERY DAY AFTER A MEAL Patient not taking: Reported on 10/31/2021 11/15/20   [provider]  predniSONE (DELTASONE) 20 MG tablet Take 2 tablets (40 mg total) by mouth daily with breakfast. 11/04/21   Bing Neighbors, FNP  promethazine-dextromethorphan (PROMETHAZINE-DM) 6.25-15 MG/5ML syrup Take 5 mLs by mouth 4 (four) times  daily as needed for cough. 11/04/21   Bing Neighbors, FNP    Family History Family History  Problem Relation Age of Onset   Diabetes Mother    Diabetes Maternal Grandmother    Arthritis Maternal Grandmother    Stroke Maternal Grandmother    Kidney failure Maternal Grandmother    Hypertension Maternal Grandmother    Heart disease Maternal Grandfather    Kidney failure Maternal Grandfather    Cardiomyopathy Paternal Grandmother    Cardiomyopathy Paternal Grandfather    Breast cancer Other        37s    Social History Social History   Tobacco Use   Smoking status: Never   Smokeless tobacco: Never  Vaping Use   Vaping Use: Never used  Substance Use Topics   Alcohol use: No     Alcohol/week: 0.0 standard drinks of alcohol   Drug use: No     Allergies   Azithromycin   Review of Systems Review of Systems  Constitutional:  Negative for chills and fever.  HENT:  Positive for congestion, ear pain, postnasal drip, rhinorrhea, sinus pressure and sinus pain. Negative for sore throat.   Respiratory:  Positive for cough. Negative for shortness of breath.   Cardiovascular:  Negative for chest pain and palpitations.  Gastrointestinal:  Negative for diarrhea and vomiting.  Skin:  Negative for color change and rash.  All other systems reviewed and are negative.    Physical Exam Triage Vital Signs ED Triage Vitals  Enc Vitals Group     BP 03/16/22 1217 112/77     Pulse Rate 03/16/22 1203 81     Resp 03/16/22 1203 18     Temp 03/16/22 1203 99.4 F (37.4 C)     Temp src --      SpO2 03/16/22 1203 96 %     Weight 03/16/22 1215 200 lb (90.7 kg)     Height 03/16/22 1215 5\' 7"  (1.702 m)     Head Circumference --      Peak Flow --      Pain Score 03/16/22 1210 9     Pain Loc --      Pain Edu? --      Excl. in GC? --    No data found.  Updated Vital Signs BP 112/77   Pulse 81   Temp 99.4 F (37.4 C)   Resp 18   Ht 5\' 7"  (1.702 m)   Wt 200 lb (90.7 kg)   LMP 03/04/2022   SpO2 96%   BMI 31.32 kg/m   Visual Acuity Right Eye Distance:   Left Eye Distance:   Bilateral Distance:    Right Eye Near:   Left Eye Near:    Bilateral Near:     Physical Exam Vitals and nursing note reviewed.  Constitutional:      General: She is not in acute distress.    Appearance: Normal appearance. She is well-developed. She is not ill-appearing.  HENT:     Right Ear: Tympanic membrane normal.     Left Ear: Tympanic membrane normal.     Nose: Congestion and rhinorrhea present.     Mouth/Throat:     Mouth: Mucous membranes are moist.     Pharynx: Oropharynx is clear.  Eyes:     Conjunctiva/sclera: Conjunctivae normal.  Cardiovascular:     Rate and Rhythm: Normal  rate and regular rhythm.     Heart sounds: Normal heart sounds.  Pulmonary:     Effort: Pulmonary  effort is normal. No respiratory distress.     Breath sounds: Normal breath sounds.  Musculoskeletal:     Cervical back: Neck supple.  Skin:    General: Skin is warm and dry.  Neurological:     Mental Status: She is alert.  Psychiatric:        Mood and Affect: Mood normal.        Behavior: Behavior normal.      UC Treatments / Results  Labs (all labs ordered are listed, but only abnormal results are displayed) Labs Reviewed - No data to display  EKG   Radiology No results found.  Procedures Procedures (including critical care time)  Medications Ordered in UC Medications - No data to display  Initial Impression / Assessment and Plan / UC Course  I have reviewed the triage vital signs and the nursing notes.  Pertinent labs & imaging results that were available during my care of the patient were reviewed by me and considered in my medical decision making (see chart for details).    Acute sinusitis.  Symptoms x 6 days.  Not improving with OTC treatment.  Treating with amoxicillin.  Discussed symptomatic treatment including Tylenol or ibuprofen, rest, hydration.  Instructed patient to follow up with PCP if symptoms are not improving.  She agrees to plan of care.   Final Clinical Impressions(s) / UC Diagnoses   Final diagnoses:  Acute non-recurrent maxillary sinusitis     Discharge Instructions      Take the amoxicillin as directed.  Follow up with your primary care provider if your symptoms are not improving.        ED Prescriptions     Medication Sig Dispense Auth. Provider   amoxicillin (AMOXIL) 875 MG tablet Take 1 tablet (875 mg total) by mouth 2 (two) times daily for 7 days. 14 tablet Mickie Bail, NP      PDMP not reviewed this encounter.   Mickie Bail, NP 03/16/22 1247

## 2022-03-16 NOTE — Discharge Instructions (Addendum)
Take the amoxicillin as directed.  Follow up with your primary care provider if your symptoms are not improving.   ° ° °

## 2022-06-25 ENCOUNTER — Telehealth: Payer: Self-pay

## 2022-06-25 NOTE — Telephone Encounter (Signed)
Kathy Williamson called and left voicemail on triage stating she's wanting to know if Elmo Putt would send ina refill of Lexapro she's having a hard time sleeping a lot of family issues going on. She didn't know if she need an appointment her annual isnt until August.  Please advise

## 2022-06-26 ENCOUNTER — Other Ambulatory Visit: Payer: Self-pay | Admitting: Obstetrics and Gynecology

## 2022-06-26 MED ORDER — ESCITALOPRAM OXALATE 20 MG PO TABS
ORAL_TABLET | ORAL | 1 refills | Status: DC
Start: 2022-06-26 — End: 2022-08-26

## 2022-06-26 NOTE — Telephone Encounter (Signed)
Pls call pt to do GAD, PHQ and put in chart. Rx lexapro eRxd, have pt RTO for f/u in 7 wks. Thx.

## 2022-06-26 NOTE — Telephone Encounter (Signed)
Great!

## 2022-06-26 NOTE — Telephone Encounter (Signed)
Pls call pt to schedule f/u in person with ABC in 7 weeks.   PHQ-9: 17 GAD-7: 18

## 2022-06-26 NOTE — Progress Notes (Signed)
Rx lexapro for stress/anxiety. Has been on it in past, needs to restart. Annual was 8/23.

## 2022-06-30 NOTE — Telephone Encounter (Signed)
Ok

## 2022-06-30 NOTE — Telephone Encounter (Addendum)
I contacted the patient via phone. The patient is schedule for 5/28 due to the patient requesting an additional week due to her girls being in school and it would help with her schedule.

## 2022-07-19 ENCOUNTER — Other Ambulatory Visit: Payer: Self-pay | Admitting: Obstetrics and Gynecology

## 2022-08-21 NOTE — Progress Notes (Signed)
Pcp, No   Chief Complaint  Patient presents with   Follow-up    HPI:      [name] is a 37 y.o. [id number] whose LMP was Patient's last menstrual period was 07/27/2022 (exact date)., presents today for anxiety/stress f/u from 3/24. Pt called asking for lexapro RF due to fam health stressors.  Restarted lexapro 20 mg 3/24 but can only tolerate 10 mg dose. Felt numb with 20 mg dose even though tolerated it in past. Has not been able to exercise recently due to fam health issues and that has affected her. Was having panic attacks but those better past few wks. Has had increased night sweats, able to sleep some. Hx of adrenal fatigue in past, not taking supp.  3/24 PHQ-9: 17/GAD-7: 18. Pt stopped OCPs a few months ago before fam health issues due to feeling mentally foggy and not feeling well overall. Not sure if better off OCPs or not since now having anxiety due to stress. Menses are monthly, lasting 5-6 days, mod flow, occas mid cycle spotting, mod dysme, improved with NSAIDs. Menses were heavier in past with clots so started OCPs. Feels like hormones have been "off" for a few yrs.   Patient Active Problem List   Diagnosis Date Noted   Anxiety and depression 12/08/2019   Pre-diabetes 12/08/2019   PMDD (premenstrual dysphoric disorder) 05/17/2019   Rectus diastasis 08/31/2018   Umbilical hernia without obstruction and without gangrene 08/31/2018   Postpartum care following vaginal delivery 05/28/2017   History of maternal third degree perineal laceration, currently pregnant 05/21/2017   History of shoulder dystocia in prior pregnancy, currently pregnant in third trimester 05/21/2017   Supervision of high risk pregnancy, antepartum 10/14/2016   Obesity affecting pregnancy 10/14/2016   BMI 30.0-30.9,adult 10/14/2016   Infertility female 06/16/2016   Rh negative state in antepartum period 05/28/2016    Past Surgical History:  Procedure Laterality Date   COLPOSCOPY N/A 2012   KNEE  SURGERY Right 2013   SHOULDER SURGERY Left 2009   TONSILLECTOMY AND ADENOIDECTOMY      Family History  Problem Relation Age of Onset   Diabetes Mother    Diabetes Maternal Grandmother    Arthritis Maternal Grandmother    Stroke Maternal Grandmother    Kidney failure Maternal Grandmother    Hypertension Maternal Grandmother    Heart disease Maternal Grandfather    Kidney failure Maternal Grandfather    Cardiomyopathy Paternal Grandmother    Cardiomyopathy Paternal Grandfather    Breast cancer Other        3s    Social History   Socioeconomic History   Marital status: Married    Spouse name: Not on file   Number of children: Not on file   Years of education: Not on file   Highest education level: Not on file  Occupational History   Not on file  Tobacco Use   Smoking status: Never   Smokeless tobacco: Never  Vaping Use   Vaping Use: Never used  Substance and Sexual Activity   Alcohol use: No    Alcohol/week: 0.0 standard drinks of alcohol   Drug use: No   Sexual activity: Yes    Birth control/protection: None  Other Topics Concern   Not on file  Social History Narrative   Not on file   Social Determinants of Health   Financial Resource Strain: Not on file  Food Insecurity: Not on file  Transportation Needs: Not on file  Physical Activity:  Not on file  Stress: Not on file  Social Connections: Not on file  Intimate Partner Violence: Not on file    Outpatient Medications Prior to Visit  Medication Sig Dispense Refill   escitalopram (LEXAPRO) 20 MG tablet Take 1/2 tab daily for 6 days, then 1 tab daily 30 tablet 1   drospirenone-ethinyl estradiol (YAZ) 3-0.02 MG tablet Take 1 tablet by mouth daily. (Patient not taking: Reported on 08/26/2022) 84 tablet 3   meloxicam (MOBIC) 7.5 MG tablet TAKE 1 TABLET BY MOUTH EVERY DAY AFTER A MEAL (Patient not taking: Reported on 10/31/2021)     albuterol (VENTOLIN HFA) 108 (90 Base) MCG/ACT inhaler Inhale 2 puffs into the  lungs every 4 (four) hours as needed for wheezing or shortness of breath. 1 each 0   predniSONE (DELTASONE) 20 MG tablet Take 2 tablets (40 mg total) by mouth daily with breakfast. 10 tablet 0   promethazine-dextromethorphan (PROMETHAZINE-DM) 6.25-15 MG/5ML syrup Take 5 mLs by mouth 4 (four) times daily as needed for cough. 180 mL 0   No facility-administered medications prior to visit.      ROS:  Review of Systems  Constitutional:  Positive for fatigue. Negative for fever.  Gastrointestinal:  Negative for blood in stool, constipation, diarrhea, nausea and vomiting.  Genitourinary:  Negative for dyspareunia, dysuria, flank pain, frequency, hematuria, urgency, vaginal bleeding, vaginal discharge and vaginal pain.  Musculoskeletal:  Negative for back pain.  Skin:  Negative for rash.  Neurological:  Positive for headaches.  Psychiatric/Behavioral:  Positive for agitation.    BREAST: No symptoms   OBJECTIVE:   Vitals:  BP 110/72   Ht 5\' 7"  (1.702 m)   Wt 209 lb (94.8 kg)   LMP 07/27/2022 (Exact Date)   BMI 32.73 kg/m   Physical Exam Vitals reviewed.  Constitutional:      Appearance: She is well-developed.  Pulmonary:     Effort: Pulmonary effort is normal.  Musculoskeletal:        General: Normal range of motion.     Cervical back: Normal range of motion.  Skin:    General: Skin is warm and dry.  Neurological:     General: No focal deficit present.     Mental Status: She is alert and oriented to person, place, and time.     Cranial Nerves: No cranial nerve deficit.  Psychiatric:        Mood and Affect: Mood normal.        Behavior: Behavior normal.        Thought Content: Thought content normal.        Judgment: Judgment normal.     Results:     08/26/2022   11:09 AM 06/26/2022    4:00 PM 10/08/2017    4:17 PM 09/08/2017    3:46 PM  GAD 7 : Generalized Anxiety Score  Nervous, Anxious, on Edge 3 3 1 1   Control/stop worrying 2 3 0 1  Worry too much - different  things 2 3 1 1   Trouble relaxing 3 3 1 2   Restless 1  0 0  Easily annoyed or irritable 2 3 1 1   Afraid - awful might happen 2 3 0 0  Total GAD 7 Score 15  4 6   Anxiety Difficulty Somewhat difficult Very difficult Somewhat difficult Somewhat difficult       08/26/2022   11:08 AM  Depression screen PHQ 2/9  Decreased Interest 2  Down, Depressed, Hopeless 3  PHQ - 2 Score 5  Altered sleeping 3  Tired, decreased energy 3  Change in appetite 1  Feeling bad or failure about yourself  3  Trouble concentrating 1  Moving slowly or fidgety/restless 0  Suicidal thoughts 0  PHQ-9 Score 16  Difficult doing work/chores Somewhat difficult    Assessment/Plan: Anxiety and depression - Plan: escitalopram (LEXAPRO) 10 MG tablet; sx improved with lexapro 10 mg but still not well-controlled. Will try 15 mg dose since can't tolerate 20 mg dose currently. Rx eRxd. Pt to f/u via MyChart in 4 wks with sx. Increase exercise when possible.  Will eval hormone issues and periods at annual 8/24. Discussed mood sx treated with mood meds regardless of hormones.    Meds ordered this encounter  Medications   escitalopram (LEXAPRO) 10 MG tablet    Sig: Take 1.5 tablets (15 mg total) by mouth daily.    Dispense:  45 tablet    Refill:  0    NOTE DOSE CHANGE    Order Specific Question:   Supervising Provider    Answer:   Hildred Laser [[id number]]      Return in about 3 months (around 11/26/2022) for annual.  Lavelle Berland B. Mela Perham, PA-C 08/26/2022 5:23 PM

## 2022-08-26 ENCOUNTER — Encounter: Payer: Self-pay | Admitting: Obstetrics and Gynecology

## 2022-08-26 ENCOUNTER — Ambulatory Visit (INDEPENDENT_AMBULATORY_CARE_PROVIDER_SITE_OTHER): Payer: BC Managed Care – PPO | Admitting: Obstetrics and Gynecology

## 2022-08-26 VITALS — BP 110/72 | Ht 67.0 in | Wt 209.0 lb

## 2022-08-26 DIAGNOSIS — F32A Depression, unspecified: Secondary | ICD-10-CM | POA: Diagnosis not present

## 2022-08-26 DIAGNOSIS — F419 Anxiety disorder, unspecified: Secondary | ICD-10-CM

## 2022-08-26 MED ORDER — ESCITALOPRAM OXALATE 10 MG PO TABS
15.0000 mg | ORAL_TABLET | Freq: Every day | ORAL | 0 refills | Status: DC
Start: 2022-08-26 — End: 2022-11-10

## 2022-08-26 NOTE — Patient Instructions (Signed)
I value your feedback and you entrusting us with your care. If you get a [facility] patient survey, I would appreciate you taking the time to let us know about your experience today. Thank you! ? ? ?

## 2022-09-17 ENCOUNTER — Other Ambulatory Visit: Payer: Self-pay | Admitting: Obstetrics and Gynecology

## 2022-09-17 DIAGNOSIS — F419 Anxiety disorder, unspecified: Secondary | ICD-10-CM

## 2022-09-21 ENCOUNTER — Other Ambulatory Visit: Payer: Self-pay | Admitting: Obstetrics and Gynecology

## 2022-09-21 DIAGNOSIS — F32A Depression, unspecified: Secondary | ICD-10-CM

## 2022-09-25 ENCOUNTER — Other Ambulatory Visit: Payer: Self-pay

## 2022-09-25 ENCOUNTER — Emergency Department
Admission: EM | Admit: 2022-09-25 | Discharge: 2022-09-25 | Disposition: A | Discharge status: Home / Self Care | Payer: BC Managed Care – PPO | Attending: Emergency Medicine | Admitting: Emergency Medicine

## 2022-09-25 ENCOUNTER — Encounter: Payer: Self-pay | Admitting: Emergency Medicine

## 2022-09-25 DIAGNOSIS — G8929 Other chronic pain: Secondary | ICD-10-CM | POA: Diagnosis not present

## 2022-09-25 DIAGNOSIS — M5440 Lumbago with sciatica, unspecified side: Secondary | ICD-10-CM | POA: Insufficient documentation

## 2022-09-25 DIAGNOSIS — M5441 Lumbago with sciatica, right side: Secondary | ICD-10-CM | POA: Diagnosis not present

## 2022-09-25 MED ORDER — KETOROLAC TROMETHAMINE 10 MG PO TABS: 10.0000 mg | ORAL_TABLET | Freq: Four times a day (QID) | ORAL | 0 refills | Status: DC | PRN

## 2022-09-25 MED ORDER — KETOROLAC TROMETHAMINE 15 MG/ML IJ SOLN
15.0000 mg | Freq: Once | INTRAMUSCULAR | Status: AC
  Administered 2022-09-25: 15 mg via INTRAMUSCULAR
  Filled 2022-09-25: qty 1

## 2022-09-25 MED ORDER — LIDOCAINE 5 % EX PTCH
2.0000 | MEDICATED_PATCH | CUTANEOUS | Status: DC
  Administered 2022-09-25: 2 via TRANSDERMAL
  Filled 2022-09-25: qty 2

## 2022-09-25 MED ORDER — LIDOCAINE 5 % EX PTCH: 1.0000 | MEDICATED_PATCH | Freq: Two times a day (BID) | CUTANEOUS | 0 refills | Status: AC

## 2022-09-25 MED ORDER — ACETAMINOPHEN 500 MG PO TABS
1000.0000 mg | ORAL_TABLET | Freq: Once | ORAL | Status: AC
  Administered 2022-09-25: 1000 mg via ORAL
  Filled 2022-09-25: qty 2

## 2022-09-25 NOTE — ED Provider Notes (Signed)
[facility] Emergency Department Provider Note     Event Date/Time   First MD Initiated Contact with Patient 09/25/22 1109     (approximate)   History   Back Pain   HPI  Meeah Totino is a 37 y.o. female presents to the emergency department for lower back pain x a few weeks. Denies recent injuries or falls. Patient reports bilateral lower back pain with descending radiation to right knee.  Patient describes pain as moderate, episodic and burning. Pain 7/10. Has taken gabapentin for pain with no relief. Pain is worse with sitting up. Patient is ambulatory with the assistance of her husband walker.  Denies fever and loss of bladder and bowel control.      Physical Exam   Triage Vital Signs: ED Triage Vitals  Enc Vitals Group     BP 09/25/22 1032 118/79     Pulse Rate 09/25/22 1032 94     Resp 09/25/22 1032 18     Temp 09/25/22 1032 98.6 F (37 C)     Temp Source 09/25/22 1032 Oral     SpO2 09/25/22 1032 98 %     Weight 09/25/22 1033 210 lb (95.3 kg)     Height 09/25/22 1033 5\' 7"  (1.702 m)     Head Circumference --      Peak Flow --      Pain Score 09/25/22 1033 9     Pain Loc --      Pain Edu? --      Excl. in GC? --     Most recent vital signs: Vitals:   09/25/22 1032  BP: 118/79  Pulse: 94  Resp: 18  Temp: 98.6 F (37 C)  SpO2: 98%    General Awake, no distress. Discomfort Skin:  Warm, dry and intact. No rashes or lesions noted.   HEENT NCAT. PERRL. EOMI. No rhinorrhea. Mucous membranes are moist.  Neck:   No cervical spine tenderness to palpation. No cervical lymphadenopathy CV:  Good peripheral perfusion. RRR RESP:  Normal effort. LCTAB ABD:  No distention. Soft. Non Tender. (-) No CVA tenderness  BACK:  Spinous process is midline without deformity or tenderness. Lumbar paraspinal tenderness bilaterally. Right (+) SLRT. MSK:   Full ROM in all joints. No swelling, deformity or tenderness.  NEURO: Cranial nerves II-XII intact.  No focal deficits. Sensation and motor function intact. Grip strength normal. LE strength 5/5.   ED Results / Procedures / Treatments   Labs (all labs ordered are listed, but only abnormal results are displayed) Labs Reviewed - No data to display  No results found.  PROCEDURES:  Critical Care performed: No  Procedures  MEDICATIONS ORDERED IN ED: Medications  lidocaine (LIDODERM) 5 % 2 patch (2 patches Transdermal Patch Applied 09/25/22 1206)  ketorolac (TORADOL) 15 MG/ML injection 15 mg (15 mg Intramuscular Given 09/25/22 1204)  acetaminophen (TYLENOL) tablet 1,000 mg (1,000 mg Oral Given 09/25/22 1203)    IMPRESSION / MDM / ASSESSMENT AND PLAN / ED COURSE  I reviewed the triage vital signs and the nursing notes.                               37 y.o. female presents to the emergency department for evaluation and treatment of chronic low back pain with increased severity last night. See HPI for further details.  Initial vital signs stable.  Differential diagnosis includes, but is not limited to  muscular strain, muscular spasm, sciatica, cauda equina  The patient was administered Toradol, Tylenol, and a lidocaine patch resulting in great improvement of symptoms.  Patient is ambulating with walker stating symptoms have resolved mildly and she is able to stand up straight with minimal pain.  Reassuring this is more likely a musculoskeletal issue.  At this time patient will be discharged home with prescriptions of Toradol and lidocaine patches.She is to follow-up with primary care.  A list of local primary care offices provided.  Given symptoms do not resolve in a week patient is instructed to follow-up with neurology for further evaluation and management. Patient is given ED precautions to return to the ED for any worsening or new symptoms. Patient verbalizes understanding. All questions and concerns were addressed during ED visit.    Patient's presentation is most consistent with  exacerbation of chronic illness.  FINAL CLINICAL IMPRESSION(S) / ED DIAGNOSES   Final diagnoses:  Chronic low back pain with sciatica, sciatica laterality unspecified, unspecified back pain laterality     Rx / DC Orders   ED Discharge Orders          Ordered    ketorolac (TORADOL) 10 MG tablet  Every 6 hours PRN        09/25/22 1304    lidocaine (LIDODERM) 5 %  Every 12 hours        09/25/22 1304             Note:  This document was prepared using Dragon voice recognition software and may include unintentional dictation errors.    Romeo Apple, Randal Goens A, PA-C 09/25/22 1313    Chesley Noon, MD 09/25/22 [id number]

## 2022-09-25 NOTE — ED Triage Notes (Signed)
Patient to ED via [facility] for lower back pain that radiates down right leg. States she injured back a few weeks ago a work. Using husbands walker today for extra stability.

## 2022-09-25 NOTE — Discharge Instructions (Addendum)
Please go to the following website to schedule new (and existing) patient appointments:   [email or web address]   The following is a list of primary care offices in the area who are accepting new patients at this time.  Please reach out to one of them directly and let them know you would like to schedule an appointment to follow up on an Emergency Department visit, and/or to establish a new primary care provider (PCP).  There are likely other primary care clinics in the are who are accepting new patients, but this is an excellent place to start:  [facility] Lead physician: Dr Angela Bacigalupo [address] [place], [place] [address] [phone]  [facility] Lead Physician: Dr Krichna Sowles [address], [place], [place] [address] [phone]  [facility]  Lead Physician: Dr Megan Johnson [address], [place], [place] [address] [phone]  [facility] [place] [facility] Lead Physician: Dr Alex Karamalegos [address], [place], [place] [address] [phone]  [facility] [place] Lead Physician: Dr Laura Berglund [address], [place], [place] [address] [phone]   

## 2022-10-03 DIAGNOSIS — M5416 Radiculopathy, lumbar region: Secondary | ICD-10-CM | POA: Diagnosis not present

## 2022-10-03 DIAGNOSIS — M5441 Lumbago with sciatica, right side: Secondary | ICD-10-CM | POA: Diagnosis not present

## 2022-10-07 ENCOUNTER — Ambulatory Visit: Payer: BC Managed Care – PPO | Admitting: Podiatry

## 2022-10-07 DIAGNOSIS — M79672 Pain in left foot: Secondary | ICD-10-CM

## 2022-10-07 DIAGNOSIS — M79671 Pain in right foot: Secondary | ICD-10-CM

## 2022-10-09 ENCOUNTER — Other Ambulatory Visit: Payer: Self-pay | Admitting: Obstetrics and Gynecology

## 2022-10-09 DIAGNOSIS — Z30011 Encounter for initial prescription of contraceptive pills: Secondary | ICD-10-CM

## 2022-10-12 NOTE — Progress Notes (Signed)
   Chief Complaint  Patient presents with   Foot Pain    Patient came in today for bilateral foot pain, heel, top of the foot, Achilles tendon pain, started in 2021, sharp shooting pain, rate of pain 3 out of 10, seen Podiatry in [place], MRI, injections, cam boots, taping, EPAT, EMG, X-Rays done today,     Subjective: 37 y.o. female presenting for follow-up evaluation of chronic bilateral foot pain.  Patient last seen in the office 01/20/2020.  At that time she was lost to follow-up because she moved to [place], [place].  She established herself with podiatry in [place], [place] and has had multiple injections, immobilization in a cam boot, taping, EPAT, EMG, x-rays, and MRIs with no alleviation of her symptoms.  She continues to have pain and tenderness associated bilateral feet.  Recently she has moved back here to [place], [place] and presents for further treatment and evaluation   Past Medical History:  Diagnosis Date   H/O shoulder dystocia in prior pregnancy, currently pregnant    G1   Infertility management    PCOS (polycystic ovarian syndrome)    Pre-diabetes    Pruritic urticarial papules and plaques of pregnancy    with G1   Third degree perineal laceration    G1   Umbilical hernia 08/2018   can follow per Dr. Lemar Livings     Objective: Physical Exam General: The patient is alert and oriented x3 in no acute distress.  Dermatology: Skin is warm, dry and supple bilateral lower extremities. Negative for open lesions or macerations bilateral.   Vascular: Dorsalis Pedis and Posterior Tibial pulses palpable bilateral.  Capillary fill time is immediate to all digits.  Neurological: Grossly intact via light touch  Musculoskeletal: Muscle strength 5/5 all compartments.  ROM WNL.  Diffuse generalized pain throughout palpation of the bilateral feet and ankles.  There is no specific pinpoint area of tenderness or pain  Assessment: 1.  Chronic generalized pain bilateral feet  Plan of Care:  -Patient  evaluated.   Patient's chart was reviewed today -Unfortunately the patient continues to have pain and tenderness to the bilateral feet.  There is nothing surgically or conservatively in addition that I can offer.  She has pursued all conservative treatment modalities and there is no focal area of concern that surgery would be warranted.  Patient simply has diffuse pain and tenderness throughout the bilateral feet -Continue good supportive shoes and arch supports -Return to clinic as needed  Felecia Shelling, DPM [facility]  Dr. Felecia Shelling, DPM    [address] [place], [place] [address]                Office [phone]  Fax [phone]

## 2022-10-16 DIAGNOSIS — M5416 Radiculopathy, lumbar region: Secondary | ICD-10-CM | POA: Diagnosis not present

## 2022-10-16 DIAGNOSIS — M5441 Lumbago with sciatica, right side: Secondary | ICD-10-CM | POA: Diagnosis not present

## 2022-10-16 DIAGNOSIS — Z6832 Body mass index (BMI) 32.0-32.9, adult: Secondary | ICD-10-CM | POA: Diagnosis not present

## 2022-10-17 ENCOUNTER — Other Ambulatory Visit: Payer: Self-pay | Admitting: Orthopedic Surgery

## 2022-10-17 DIAGNOSIS — M5441 Lumbago with sciatica, right side: Secondary | ICD-10-CM

## 2022-10-17 DIAGNOSIS — M5416 Radiculopathy, lumbar region: Secondary | ICD-10-CM

## 2022-10-30 ENCOUNTER — Encounter: Payer: Self-pay | Admitting: Orthopedic Surgery

## 2022-11-03 ENCOUNTER — Ambulatory Visit
Admission: RE | Admit: 2022-11-03 | Discharge: 2022-11-03 | Disposition: A | Discharge status: Home / Self Care | Payer: BC Managed Care – PPO | Source: Ambulatory Visit | Attending: Orthopedic Surgery | Admitting: Orthopedic Surgery

## 2022-11-03 DIAGNOSIS — M5126 Other intervertebral disc displacement, lumbar region: Secondary | ICD-10-CM | POA: Diagnosis not present

## 2022-11-03 DIAGNOSIS — M5441 Lumbago with sciatica, right side: Secondary | ICD-10-CM | POA: Diagnosis not present

## 2022-11-03 DIAGNOSIS — M5416 Radiculopathy, lumbar region: Secondary | ICD-10-CM

## 2022-11-06 DIAGNOSIS — G8929 Other chronic pain: Secondary | ICD-10-CM | POA: Diagnosis not present

## 2022-11-06 DIAGNOSIS — M5442 Lumbago with sciatica, left side: Secondary | ICD-10-CM | POA: Diagnosis not present

## 2022-11-09 NOTE — Progress Notes (Unsigned)
No chief complaint on file.   HPI:      [name] is a 37 y.o. [id number] whose LMP was No LMP recorded., presents today for her annual examination.  Her menses are monthly, lasting 6 days, mod to heavy flow, changing products Q 1 1/2-2 hrs on heavy days, with dime sized clots, occas BTB with ovulation and dysmen, improved with NSAIDs. Has PMS/PMDD sx week before menses, and generally only feels good 2 of 4 wks each month. Hx of menstrual headaches too. No hx of HTN, DVTs, migraines with aura. Is getting increased night sweats, also increased acne. Pt has questions about endometrial ablation. Hx of PCOS. Was taking prometrium 200 mg daily to regulate cycles but that stopped working, so taking cyclically again with monthly menses. Used to take metformin but stopped that since lost wt and exercising regularly now. Normal HgA1C 11/22.  Sex activity: single partner--no dyspareunia; husband has extra skin covering his penis affecting function; surgery is remedy but not doing right now  Last Pap: 10/31/21  Results were: no abnormalities /neg HPV DNA  Hx of STDs: none  There is a FH of breast cancer in her mat grt aunt, genetic testing not indicated. There is no FH of ovarian cancer. The patient does do self-breast exams.  Tobacco use: The patient denies current or previous tobacco use. Alcohol use: none  Drug use: none Exercise: very active  She does get adequate calcium and Vitamin D in her diet. Hx of pre-DM in past, did metformin. Normal HgA1C 11/22, repeat labs today.   Has umbilical hernia and diastasis recti. Saw Dr. Lemar Livings 6/20, will follow expectantly for now.  Did lexapro in past for anxiety/depression. Weaned off over a year ago and doing well.   Having issues with hair loss. Has been under increased stress. Taking MVI with iron.    Past Medical History:  Diagnosis Date   H/O shoulder dystocia in prior pregnancy, currently pregnant    G1   Infertility management    PCOS  (polycystic ovarian syndrome)    Pre-diabetes    Pruritic urticarial papules and plaques of pregnancy    with G1   Third degree perineal laceration    G1   Umbilical hernia 08/2018   can follow per Dr. Lemar Livings    Past Surgical History:  Procedure Laterality Date   COLPOSCOPY N/A 2012   KNEE SURGERY Right 2013   SHOULDER SURGERY Left 2009   TONSILLECTOMY AND ADENOIDECTOMY      Family History  Problem Relation Age of Onset   Diabetes Mother    Diabetes Maternal Grandmother    Arthritis Maternal Grandmother    Stroke Maternal Grandmother    Kidney failure Maternal Grandmother    Hypertension Maternal Grandmother    Heart disease Maternal Grandfather    Kidney failure Maternal Grandfather    Cardiomyopathy Paternal Grandmother    Cardiomyopathy Paternal Grandfather    Breast cancer Other        50s     ROS: Review of Systems  Constitutional:  Positive for fatigue. Negative for fever and unexpected weight change.  Respiratory:  Negative for cough, shortness of breath and wheezing.   Cardiovascular:  Negative for chest pain, palpitations and leg swelling.  Gastrointestinal:  Negative for blood in stool, constipation, diarrhea, nausea and vomiting.  Endocrine: Negative for cold intolerance, heat intolerance and polyuria.  Genitourinary:  Negative for dyspareunia, dysuria, flank pain, frequency, genital sores, hematuria, menstrual problem, pelvic pain, urgency, vaginal bleeding,  vaginal discharge and vaginal pain.  Musculoskeletal:  Negative for back pain, joint swelling and myalgias.  Skin:  Negative for rash.  Neurological:  Negative for dizziness, syncope, light-headedness, numbness and headaches.  Hematological:  Negative for adenopathy.  Psychiatric/Behavioral:  Negative for agitation, confusion, sleep disturbance and suicidal ideas. The patient is not nervous/anxious.     Objective: There were no vitals taken for this visit.   Physical Exam Constitutional:       Appearance: She is well-developed.  Genitourinary:     Vulva normal.     Right Labia: No rash, tenderness or lesions.    Left Labia: No tenderness, lesions or rash.    No vaginal discharge, erythema or tenderness.      Right Adnexa: not tender and no mass present.    Left Adnexa: not tender and no mass present.    No cervical motion tenderness, friability or polyp.     Uterus is not enlarged or tender.  Breasts:    Right: No mass, nipple discharge, skin change or tenderness.     Left: No mass, nipple discharge, skin change or tenderness.  Neck:     Thyroid: No thyromegaly.  Cardiovascular:     Rate and Rhythm: Normal rate and regular rhythm.     Heart sounds: Normal heart sounds. No murmur heard. Pulmonary:     Effort: Pulmonary effort is normal.     Breath sounds: Normal breath sounds. No stridor.  Abdominal:     Palpations: Abdomen is soft.     Tenderness: There is no abdominal tenderness. There is no guarding or rebound.  Musculoskeletal:        General: Normal range of motion.     Cervical back: Normal range of motion.  Lymphadenopathy:     Cervical: No cervical adenopathy.  Neurological:     General: No focal deficit present.     Mental Status: She is alert and oriented to person, place, and time.     Cranial Nerves: No cranial nerve deficit.  Skin:    General: Skin is warm and dry.  Psychiatric:        Mood and Affect: Mood normal.        Behavior: Behavior normal.        Thought Content: Thought content normal.        Judgment: Judgment normal.  Vitals reviewed.     Assessment/Plan: Encounter for annual routine gynecological examination  Cervical cancer screening - Plan: Cytology - PAP  Screening for HPV (human papillomavirus) - Plan: Cytology - PAP  PCOS (polycystic ovarian syndrome)--discussed trying OCPs for cycle control, PMS and acne improvement.   Menorrhagia with regular cycle - Plan: CBC with Differential/Platelet; check labs. Start OCPs. F/u prn.    Encounter for initial prescription of contraceptive pills - Plan: drospirenone-ethinyl estradiol (YAZ) 3-0.02 MG tablet; Rx yaz, f/u prn sx.   Blood tests for routine general physical examination - Plan: Comprehensive metabolic panel, CBC with Differential/Platelet, TSH, T4, free, Hemoglobin A1c  Thyroid disorder screening - Plan: TSH, T4, free  Screening for diabetes mellitus - Plan: Hemoglobin A1c  Pre-diabetes   No orders of the defined types were placed in this encounter.              GYN counsel adequate intake of calcium and vitamin D, diet and exercise     F/U  No follow-ups on file.  Kylee Umana B. Kiona Blume, PA-C 11/09/2022 9:04 PM

## 2022-11-10 ENCOUNTER — Encounter: Payer: Self-pay | Admitting: Obstetrics and Gynecology

## 2022-11-10 ENCOUNTER — Ambulatory Visit (INDEPENDENT_AMBULATORY_CARE_PROVIDER_SITE_OTHER): Payer: BC Managed Care – PPO | Admitting: Obstetrics and Gynecology

## 2022-11-10 VITALS — BP 118/76 | Ht 67.0 in | Wt 220.0 lb

## 2022-11-10 DIAGNOSIS — N920 Excessive and frequent menstruation with regular cycle: Secondary | ICD-10-CM | POA: Diagnosis not present

## 2022-11-10 DIAGNOSIS — Z01419 Encounter for gynecological examination (general) (routine) without abnormal findings: Secondary | ICD-10-CM

## 2022-11-10 DIAGNOSIS — Z0000 Encounter for general adult medical examination without abnormal findings: Secondary | ICD-10-CM

## 2022-11-10 DIAGNOSIS — Z1329 Encounter for screening for other suspected endocrine disorder: Secondary | ICD-10-CM

## 2022-11-10 DIAGNOSIS — E282 Polycystic ovarian syndrome: Secondary | ICD-10-CM

## 2022-11-10 DIAGNOSIS — R7303 Prediabetes: Secondary | ICD-10-CM

## 2022-11-10 DIAGNOSIS — F32A Depression, unspecified: Secondary | ICD-10-CM

## 2022-11-10 DIAGNOSIS — L659 Nonscarring hair loss, unspecified: Secondary | ICD-10-CM

## 2022-11-10 NOTE — Patient Instructions (Signed)
I value your feedback and you entrusting us with your care. If you get a [facility] patient survey, I would appreciate you taking the time to let us know about your experience today. Thank you! ? ? ?

## 2022-11-12 DIAGNOSIS — M5432 Sciatica, left side: Secondary | ICD-10-CM | POA: Diagnosis not present

## 2022-11-12 DIAGNOSIS — M9902 Segmental and somatic dysfunction of thoracic region: Secondary | ICD-10-CM | POA: Diagnosis not present

## 2022-11-12 DIAGNOSIS — M62830 Muscle spasm of back: Secondary | ICD-10-CM | POA: Diagnosis not present

## 2022-11-12 DIAGNOSIS — M9906 Segmental and somatic dysfunction of lower extremity: Secondary | ICD-10-CM | POA: Diagnosis not present

## 2022-11-12 DIAGNOSIS — M6249 Contracture of muscle, multiple sites: Secondary | ICD-10-CM | POA: Diagnosis not present

## 2022-11-12 DIAGNOSIS — M9903 Segmental and somatic dysfunction of lumbar region: Secondary | ICD-10-CM | POA: Diagnosis not present

## 2022-11-13 DIAGNOSIS — M62830 Muscle spasm of back: Secondary | ICD-10-CM | POA: Diagnosis not present

## 2022-11-13 DIAGNOSIS — M9903 Segmental and somatic dysfunction of lumbar region: Secondary | ICD-10-CM | POA: Diagnosis not present

## 2022-11-13 DIAGNOSIS — M5432 Sciatica, left side: Secondary | ICD-10-CM | POA: Diagnosis not present

## 2022-11-13 DIAGNOSIS — M9906 Segmental and somatic dysfunction of lower extremity: Secondary | ICD-10-CM | POA: Diagnosis not present

## 2022-11-13 DIAGNOSIS — M6249 Contracture of muscle, multiple sites: Secondary | ICD-10-CM | POA: Diagnosis not present

## 2022-11-13 DIAGNOSIS — M9902 Segmental and somatic dysfunction of thoracic region: Secondary | ICD-10-CM | POA: Diagnosis not present

## 2022-11-17 ENCOUNTER — Ambulatory Visit: Payer: BC Managed Care – PPO

## 2022-11-17 ENCOUNTER — Ambulatory Visit (INDEPENDENT_AMBULATORY_CARE_PROVIDER_SITE_OTHER): Payer: BC Managed Care – PPO | Admitting: Podiatry

## 2022-11-17 ENCOUNTER — Encounter: Payer: Self-pay | Admitting: Podiatry

## 2022-11-17 DIAGNOSIS — S92404A Nondisplaced unspecified fracture of right great toe, initial encounter for closed fracture: Secondary | ICD-10-CM

## 2022-11-17 DIAGNOSIS — M62830 Muscle spasm of back: Secondary | ICD-10-CM | POA: Diagnosis not present

## 2022-11-17 DIAGNOSIS — M9902 Segmental and somatic dysfunction of thoracic region: Secondary | ICD-10-CM | POA: Diagnosis not present

## 2022-11-17 DIAGNOSIS — M5432 Sciatica, left side: Secondary | ICD-10-CM | POA: Diagnosis not present

## 2022-11-17 DIAGNOSIS — M9906 Segmental and somatic dysfunction of lower extremity: Secondary | ICD-10-CM | POA: Diagnosis not present

## 2022-11-17 DIAGNOSIS — L600 Ingrowing nail: Secondary | ICD-10-CM | POA: Diagnosis not present

## 2022-11-17 DIAGNOSIS — M9903 Segmental and somatic dysfunction of lumbar region: Secondary | ICD-10-CM | POA: Diagnosis not present

## 2022-11-17 DIAGNOSIS — M6249 Contracture of muscle, multiple sites: Secondary | ICD-10-CM | POA: Diagnosis not present

## 2022-11-17 MED ORDER — CEPHALEXIN 500 MG PO CAPS: 500.0000 mg | ORAL_CAPSULE | Freq: Three times a day (TID) | ORAL | 0 refills | Status: AC

## 2022-11-17 MED ORDER — NEOMYCIN-POLYMYXIN-HC 3.5-10000-1 OT SUSP: OTIC | 0 refills | Status: AC

## 2022-11-17 NOTE — Patient Instructions (Signed)

## 2022-11-17 NOTE — Progress Notes (Signed)
  Subjective:  Patient ID: Kathy Williamson, female    DOB: [birth year],  MRN: [id number]  Chief Complaint  Patient presents with   Nail Problem    "I dropped an object on my toe months ago.  Now it's ingrown." (Patient refused x-ray) N - ingrown toenail  L - hallux right D - 2 weeks badly O - suddenly, gradually worse C - was throbbing, sore to touch, drainage A - touch T - clipped the corner out, antibiotic ointment, cover with a bandage, swab out drainage    37 y.o. female presents with the above complaint. History confirmed with patient.   Objective:  Physical Exam: warm, good capillary refill, no trophic changes or ulcerative lesions, normal DP and PT pulses, normal sensory exam, and ingrowing right hallux medial and lateral border, lateral border has some granulation tissue  Assessment:   1. Ingrowing right great toenail      Plan:  Patient was evaluated and treated and all questions answered.     Ingrown Nail, right -Patient elects to proceed with minor surgery to remove ingrown toenail today. Consent reviewed and signed by patient. -Ingrown nail excised. See procedure note. -Educated on post-procedure care including soaking. Written instructions provided and reviewed. -Rx for Cortisporin and Keflex sent to pharmacy. -Advised on signs and symptoms of infection developing.  We discussed that the phenol likely will create some redness and edema and tenderness around the nailbed as long as it is localized this is to be expected.  Will return as needed if any infection signs develop  Procedure: Excision of Ingrown Toenail Location: Right 1st toe lateral nail borders. Anesthesia: Lidocaine 1% plain; 1.5 mL and Marcaine 0.5% plain; 1.5 mL, digital block. Skin Prep: Betadine. Dressing: Silvadene; telfa; dry, sterile, compression dressing. Technique: Following skin prep, the toe was exsanguinated and a tourniquet was secured at the base of the toe. The affected nail border was  freed, split with a nail splitter, and excised. Chemical matrixectomy was then performed with phenol and irrigated out with alcohol. The tourniquet was then removed and sterile dressing applied. Disposition: Patient tolerated procedure well.    Return if symptoms worsen or fail to improve.

## 2022-11-19 DIAGNOSIS — M9906 Segmental and somatic dysfunction of lower extremity: Secondary | ICD-10-CM | POA: Diagnosis not present

## 2022-11-19 DIAGNOSIS — M6249 Contracture of muscle, multiple sites: Secondary | ICD-10-CM | POA: Diagnosis not present

## 2022-11-19 DIAGNOSIS — M62830 Muscle spasm of back: Secondary | ICD-10-CM | POA: Diagnosis not present

## 2022-11-19 DIAGNOSIS — M9902 Segmental and somatic dysfunction of thoracic region: Secondary | ICD-10-CM | POA: Diagnosis not present

## 2022-11-19 DIAGNOSIS — M9903 Segmental and somatic dysfunction of lumbar region: Secondary | ICD-10-CM | POA: Diagnosis not present

## 2022-11-19 DIAGNOSIS — M5432 Sciatica, left side: Secondary | ICD-10-CM | POA: Diagnosis not present

## 2022-11-24 DIAGNOSIS — M6249 Contracture of muscle, multiple sites: Secondary | ICD-10-CM | POA: Diagnosis not present

## 2022-11-24 DIAGNOSIS — M9903 Segmental and somatic dysfunction of lumbar region: Secondary | ICD-10-CM | POA: Diagnosis not present

## 2022-11-24 DIAGNOSIS — M9902 Segmental and somatic dysfunction of thoracic region: Secondary | ICD-10-CM | POA: Diagnosis not present

## 2022-11-24 DIAGNOSIS — M5432 Sciatica, left side: Secondary | ICD-10-CM | POA: Diagnosis not present

## 2022-11-24 DIAGNOSIS — M9906 Segmental and somatic dysfunction of lower extremity: Secondary | ICD-10-CM | POA: Diagnosis not present

## 2022-11-24 DIAGNOSIS — M62830 Muscle spasm of back: Secondary | ICD-10-CM | POA: Diagnosis not present

## 2022-11-26 DIAGNOSIS — M9906 Segmental and somatic dysfunction of lower extremity: Secondary | ICD-10-CM | POA: Diagnosis not present

## 2022-11-26 DIAGNOSIS — M6249 Contracture of muscle, multiple sites: Secondary | ICD-10-CM | POA: Diagnosis not present

## 2022-11-26 DIAGNOSIS — M5432 Sciatica, left side: Secondary | ICD-10-CM | POA: Diagnosis not present

## 2022-11-26 DIAGNOSIS — M9903 Segmental and somatic dysfunction of lumbar region: Secondary | ICD-10-CM | POA: Diagnosis not present

## 2022-11-26 DIAGNOSIS — M9902 Segmental and somatic dysfunction of thoracic region: Secondary | ICD-10-CM | POA: Diagnosis not present

## 2022-11-26 DIAGNOSIS — M62830 Muscle spasm of back: Secondary | ICD-10-CM | POA: Diagnosis not present

## 2022-12-03 DIAGNOSIS — M9902 Segmental and somatic dysfunction of thoracic region: Secondary | ICD-10-CM | POA: Diagnosis not present

## 2022-12-03 DIAGNOSIS — M9903 Segmental and somatic dysfunction of lumbar region: Secondary | ICD-10-CM | POA: Diagnosis not present

## 2022-12-03 DIAGNOSIS — M62830 Muscle spasm of back: Secondary | ICD-10-CM | POA: Diagnosis not present

## 2022-12-03 DIAGNOSIS — M6249 Contracture of muscle, multiple sites: Secondary | ICD-10-CM | POA: Diagnosis not present

## 2022-12-03 DIAGNOSIS — M5432 Sciatica, left side: Secondary | ICD-10-CM | POA: Diagnosis not present

## 2022-12-03 DIAGNOSIS — M9906 Segmental and somatic dysfunction of lower extremity: Secondary | ICD-10-CM | POA: Diagnosis not present

## 2022-12-08 DIAGNOSIS — M62830 Muscle spasm of back: Secondary | ICD-10-CM | POA: Diagnosis not present

## 2022-12-08 DIAGNOSIS — M5432 Sciatica, left side: Secondary | ICD-10-CM | POA: Diagnosis not present

## 2022-12-08 DIAGNOSIS — M9903 Segmental and somatic dysfunction of lumbar region: Secondary | ICD-10-CM | POA: Diagnosis not present

## 2022-12-08 DIAGNOSIS — M6249 Contracture of muscle, multiple sites: Secondary | ICD-10-CM | POA: Diagnosis not present

## 2022-12-08 DIAGNOSIS — M9906 Segmental and somatic dysfunction of lower extremity: Secondary | ICD-10-CM | POA: Diagnosis not present

## 2022-12-08 DIAGNOSIS — M9902 Segmental and somatic dysfunction of thoracic region: Secondary | ICD-10-CM | POA: Diagnosis not present

## 2022-12-15 DIAGNOSIS — M5432 Sciatica, left side: Secondary | ICD-10-CM | POA: Diagnosis not present

## 2022-12-15 DIAGNOSIS — M9903 Segmental and somatic dysfunction of lumbar region: Secondary | ICD-10-CM | POA: Diagnosis not present

## 2022-12-15 DIAGNOSIS — M62830 Muscle spasm of back: Secondary | ICD-10-CM | POA: Diagnosis not present

## 2022-12-15 DIAGNOSIS — M6249 Contracture of muscle, multiple sites: Secondary | ICD-10-CM | POA: Diagnosis not present

## 2022-12-15 DIAGNOSIS — M9906 Segmental and somatic dysfunction of lower extremity: Secondary | ICD-10-CM | POA: Diagnosis not present

## 2022-12-15 DIAGNOSIS — M9902 Segmental and somatic dysfunction of thoracic region: Secondary | ICD-10-CM | POA: Diagnosis not present

## 2022-12-23 DIAGNOSIS — J301 Allergic rhinitis due to pollen: Secondary | ICD-10-CM | POA: Diagnosis not present

## 2022-12-23 DIAGNOSIS — M19011 Primary osteoarthritis, right shoulder: Secondary | ICD-10-CM | POA: Diagnosis not present

## 2022-12-29 DIAGNOSIS — M6249 Contracture of muscle, multiple sites: Secondary | ICD-10-CM | POA: Diagnosis not present

## 2022-12-29 DIAGNOSIS — M9906 Segmental and somatic dysfunction of lower extremity: Secondary | ICD-10-CM | POA: Diagnosis not present

## 2022-12-29 DIAGNOSIS — M62830 Muscle spasm of back: Secondary | ICD-10-CM | POA: Diagnosis not present

## 2022-12-29 DIAGNOSIS — M5432 Sciatica, left side: Secondary | ICD-10-CM | POA: Diagnosis not present

## 2022-12-29 DIAGNOSIS — M9903 Segmental and somatic dysfunction of lumbar region: Secondary | ICD-10-CM | POA: Diagnosis not present

## 2022-12-29 DIAGNOSIS — M9902 Segmental and somatic dysfunction of thoracic region: Secondary | ICD-10-CM | POA: Diagnosis not present

## 2023-01-05 DIAGNOSIS — M62830 Muscle spasm of back: Secondary | ICD-10-CM | POA: Diagnosis not present

## 2023-01-05 DIAGNOSIS — M5432 Sciatica, left side: Secondary | ICD-10-CM | POA: Diagnosis not present

## 2023-01-05 DIAGNOSIS — M6249 Contracture of muscle, multiple sites: Secondary | ICD-10-CM | POA: Diagnosis not present

## 2023-01-05 DIAGNOSIS — M9902 Segmental and somatic dysfunction of thoracic region: Secondary | ICD-10-CM | POA: Diagnosis not present

## 2023-01-05 DIAGNOSIS — M9906 Segmental and somatic dysfunction of lower extremity: Secondary | ICD-10-CM | POA: Diagnosis not present

## 2023-01-05 DIAGNOSIS — M9903 Segmental and somatic dysfunction of lumbar region: Secondary | ICD-10-CM | POA: Diagnosis not present

## 2023-01-12 DIAGNOSIS — M9903 Segmental and somatic dysfunction of lumbar region: Secondary | ICD-10-CM | POA: Diagnosis not present

## 2023-01-12 DIAGNOSIS — M5432 Sciatica, left side: Secondary | ICD-10-CM | POA: Diagnosis not present

## 2023-01-12 DIAGNOSIS — M62830 Muscle spasm of back: Secondary | ICD-10-CM | POA: Diagnosis not present

## 2023-01-12 DIAGNOSIS — M9906 Segmental and somatic dysfunction of lower extremity: Secondary | ICD-10-CM | POA: Diagnosis not present

## 2023-01-12 DIAGNOSIS — M9902 Segmental and somatic dysfunction of thoracic region: Secondary | ICD-10-CM | POA: Diagnosis not present

## 2023-01-12 DIAGNOSIS — M6249 Contracture of muscle, multiple sites: Secondary | ICD-10-CM | POA: Diagnosis not present

## 2023-01-26 DIAGNOSIS — M9903 Segmental and somatic dysfunction of lumbar region: Secondary | ICD-10-CM | POA: Diagnosis not present

## 2023-01-26 DIAGNOSIS — M62830 Muscle spasm of back: Secondary | ICD-10-CM | POA: Diagnosis not present

## 2023-01-26 DIAGNOSIS — M6249 Contracture of muscle, multiple sites: Secondary | ICD-10-CM | POA: Diagnosis not present

## 2023-01-26 DIAGNOSIS — M9902 Segmental and somatic dysfunction of thoracic region: Secondary | ICD-10-CM | POA: Diagnosis not present

## 2023-01-26 DIAGNOSIS — M5432 Sciatica, left side: Secondary | ICD-10-CM | POA: Diagnosis not present

## 2023-01-26 DIAGNOSIS — M9906 Segmental and somatic dysfunction of lower extremity: Secondary | ICD-10-CM | POA: Diagnosis not present

## 2023-01-29 DIAGNOSIS — M25519 Pain in unspecified shoulder: Secondary | ICD-10-CM | POA: Diagnosis not present

## 2023-02-09 DIAGNOSIS — M25519 Pain in unspecified shoulder: Secondary | ICD-10-CM | POA: Diagnosis not present

## 2023-02-11 ENCOUNTER — Other Ambulatory Visit: Payer: Self-pay | Admitting: Sports Medicine

## 2023-02-11 DIAGNOSIS — M19011 Primary osteoarthritis, right shoulder: Secondary | ICD-10-CM

## 2023-02-11 DIAGNOSIS — G8929 Other chronic pain: Secondary | ICD-10-CM

## 2023-02-18 ENCOUNTER — Other Ambulatory Visit: Payer: Self-pay | Admitting: Podiatry

## 2023-02-18 ENCOUNTER — Telehealth: Payer: Self-pay | Admitting: Podiatry

## 2023-02-18 DIAGNOSIS — M25519 Pain in unspecified shoulder: Secondary | ICD-10-CM | POA: Diagnosis not present

## 2023-02-18 MED ORDER — GABAPENTIN 100 MG PO CAPS
100.0000 mg | ORAL_CAPSULE | Freq: Three times a day (TID) | ORAL | 3 refills | Status: AC
Start: 2023-02-18 — End: ?

## 2023-02-18 NOTE — Telephone Encounter (Signed)
Rx sent to pharmacy. Thanks, Dr. Logan Bores

## 2023-02-18 NOTE — Telephone Encounter (Signed)
Patient called stating that she was given instruction to request a refill on medications after being seen back in July. She stated Dr.Evans advised to send him a message asking for a refill on   gabapentin (NEURONTIN) 100 MG capsule   Please advise, thanks!!!

## 2023-02-23 ENCOUNTER — Encounter: Payer: Self-pay | Admitting: Sports Medicine

## 2023-03-03 DIAGNOSIS — M9903 Segmental and somatic dysfunction of lumbar region: Secondary | ICD-10-CM | POA: Diagnosis not present

## 2023-03-03 DIAGNOSIS — M62830 Muscle spasm of back: Secondary | ICD-10-CM | POA: Diagnosis not present

## 2023-03-03 DIAGNOSIS — M9902 Segmental and somatic dysfunction of thoracic region: Secondary | ICD-10-CM | POA: Diagnosis not present

## 2023-03-03 DIAGNOSIS — M9906 Segmental and somatic dysfunction of lower extremity: Secondary | ICD-10-CM | POA: Diagnosis not present

## 2023-03-03 DIAGNOSIS — M6249 Contracture of muscle, multiple sites: Secondary | ICD-10-CM | POA: Diagnosis not present

## 2023-03-03 DIAGNOSIS — M5432 Sciatica, left side: Secondary | ICD-10-CM | POA: Diagnosis not present

## 2023-03-04 ENCOUNTER — Ambulatory Visit
Admission: RE | Admit: 2023-03-04 | Discharge: 2023-03-04 | Disposition: A | Discharge status: Home / Self Care | Payer: BC Managed Care – PPO | Source: Ambulatory Visit | Attending: Sports Medicine | Admitting: Sports Medicine

## 2023-03-04 DIAGNOSIS — M19011 Primary osteoarthritis, right shoulder: Secondary | ICD-10-CM

## 2023-03-04 DIAGNOSIS — M75101 Unspecified rotator cuff tear or rupture of right shoulder, not specified as traumatic: Secondary | ICD-10-CM | POA: Diagnosis not present

## 2023-03-04 DIAGNOSIS — G8929 Other chronic pain: Secondary | ICD-10-CM

## 2023-03-09 DIAGNOSIS — M62830 Muscle spasm of back: Secondary | ICD-10-CM | POA: Diagnosis not present

## 2023-03-09 DIAGNOSIS — M9903 Segmental and somatic dysfunction of lumbar region: Secondary | ICD-10-CM | POA: Diagnosis not present

## 2023-03-09 DIAGNOSIS — M6249 Contracture of muscle, multiple sites: Secondary | ICD-10-CM | POA: Diagnosis not present

## 2023-03-09 DIAGNOSIS — M5432 Sciatica, left side: Secondary | ICD-10-CM | POA: Diagnosis not present

## 2023-03-09 DIAGNOSIS — M9902 Segmental and somatic dysfunction of thoracic region: Secondary | ICD-10-CM | POA: Diagnosis not present

## 2023-03-09 DIAGNOSIS — M9906 Segmental and somatic dysfunction of lower extremity: Secondary | ICD-10-CM | POA: Diagnosis not present

## 2023-03-17 DIAGNOSIS — M25519 Pain in unspecified shoulder: Secondary | ICD-10-CM | POA: Diagnosis not present

## 2023-03-30 DIAGNOSIS — M25519 Pain in unspecified shoulder: Secondary | ICD-10-CM | POA: Diagnosis not present

## 2023-04-14 DIAGNOSIS — M5432 Sciatica, left side: Secondary | ICD-10-CM | POA: Diagnosis not present

## 2023-04-14 DIAGNOSIS — M6249 Contracture of muscle, multiple sites: Secondary | ICD-10-CM | POA: Diagnosis not present

## 2023-04-14 DIAGNOSIS — M9906 Segmental and somatic dysfunction of lower extremity: Secondary | ICD-10-CM | POA: Diagnosis not present

## 2023-04-14 DIAGNOSIS — M62830 Muscle spasm of back: Secondary | ICD-10-CM | POA: Diagnosis not present

## 2023-04-14 DIAGNOSIS — M9902 Segmental and somatic dysfunction of thoracic region: Secondary | ICD-10-CM | POA: Diagnosis not present

## 2023-04-14 DIAGNOSIS — M9903 Segmental and somatic dysfunction of lumbar region: Secondary | ICD-10-CM | POA: Diagnosis not present

## 2023-05-07 DIAGNOSIS — M19011 Primary osteoarthritis, right shoulder: Secondary | ICD-10-CM | POA: Diagnosis not present

## 2023-12-07 NOTE — Progress Notes (Unsigned)
 No chief complaint on file.   HPI:      [name] is a 38 y.o. [id number] whose LMP was No LMP recorded., presents today for her annual examination.  Her menses are monthly, lasting 5-6 days, mod to heavy flow, changing products Q 1 1/2-2 hrs on heavy days, with dime sized clots, occas BTB with ovulation and dysmen, improved with NSAIDs. Hx of PCOS. Was taking prometrium  200 mg daily or cyclically in past to regulate cycles but that stopped working. Used to take metformin  but stopped that since lost wt and exercising regularly now. Normal HgA1C 11/22 and 8/23.  Sex activity: single partner--no dyspareunia; husband has extra skin covering his penis affecting function; surgery is remedy but not doing right now  Last Pap: 10/31/21  Results were: no abnormalities /neg HPV DNA  Hx of STDs: none  There is a FH of breast cancer in her mat grt aunt, genetic testing not indicated. There is no FH of ovarian cancer. The patient does do self-breast exams.  Tobacco use: The patient denies current or previous tobacco use. Alcohol use: none  Drug use: none Exercise: mod active  She does get adequate calcium and Vitamin D in her diet. Hx of pre-DM in past, did metformin . Normal HgA1C 11/22 and 8/23, repeat labs today.   Has umbilical hernia and diastasis recti. Saw Dr. Dessa 6/20, will follow expectantly for now.  Did lexapro  in past for anxiety/depression. Weaned off in past, restarted 3/24, increased 5/24 but can't tolerate. Has stopped meds, trying natural remedies. Can't exercise right now due to bulging disc dx; just had MRI, awaiting results, starting PT. Major health issues with husband this yr, as well.  Has been tough time.   Still having issues with hair loss. Has been under increased stress. Taking MVI with iron. Had neg labs 8/23.    Past Medical History:  Diagnosis Date   H/O shoulder dystocia in prior pregnancy, currently pregnant    G1   Infertility management    PCOS  (polycystic ovarian syndrome)    Pre-diabetes    Pruritic urticarial papules and plaques of pregnancy    with G1   Third degree perineal laceration    G1   Umbilical hernia 08/2018   can follow per Dr. Dessa    Past Surgical History:  Procedure Laterality Date   COLPOSCOPY N/A 2012   KNEE SURGERY Right 2013   SHOULDER SURGERY Left 2009   TONSILLECTOMY AND ADENOIDECTOMY      Family History  Problem Relation Age of Onset   Diabetes Mother    Diabetes Maternal Grandmother    Arthritis Maternal Grandmother    Stroke Maternal Grandmother    Kidney failure Maternal Grandmother    Hypertension Maternal Grandmother    Heart disease Maternal Grandfather    Kidney failure Maternal Grandfather    Cardiomyopathy Paternal Grandmother    Cardiomyopathy Paternal Grandfather    Breast cancer Other        50s     ROS: Review of Systems  Constitutional:  Positive for fatigue. Negative for fever and unexpected weight change.  Respiratory:  Negative for cough, shortness of breath and wheezing.   Cardiovascular:  Negative for chest pain, palpitations and leg swelling.  Gastrointestinal:  Negative for blood in stool, constipation, diarrhea, nausea and vomiting.  Endocrine: Negative for cold intolerance, heat intolerance and polyuria.  Genitourinary:  Negative for dyspareunia, dysuria, flank pain, frequency, genital sores, hematuria, menstrual problem, pelvic pain, urgency, vaginal bleeding,  vaginal discharge and vaginal pain.  Musculoskeletal:  Positive for arthralgias. Negative for back pain, joint swelling and myalgias.  Skin:  Negative for rash.  Neurological:  Negative for dizziness, syncope, light-headedness, numbness and headaches.  Hematological:  Negative for adenopathy.  Psychiatric/Behavioral:  Negative for agitation, confusion, sleep disturbance and suicidal ideas. The patient is not nervous/anxious.     Objective: There were no vitals taken for this visit.   Physical  Exam Constitutional:      Appearance: She is well-developed.  Genitourinary:     Vulva normal.     Right Labia: No rash, tenderness or lesions.    Left Labia: No tenderness, lesions or rash.    No vaginal discharge, erythema or tenderness.      Right Adnexa: not tender and no mass present.    Left Adnexa: not tender and no mass present.    No cervical motion tenderness, friability or polyp.     Uterus is not enlarged or tender.  Breasts:    Right: No mass, nipple discharge, skin change or tenderness.     Left: No mass, nipple discharge, skin change or tenderness.  Neck:     Thyroid : No thyromegaly.  Cardiovascular:     Rate and Rhythm: Normal rate and regular rhythm.     Heart sounds: Normal heart sounds. No murmur heard. Pulmonary:     Effort: Pulmonary effort is normal.     Breath sounds: Normal breath sounds. No stridor.  Abdominal:     Palpations: Abdomen is soft.     Tenderness: There is no abdominal tenderness. There is no guarding or rebound.  Musculoskeletal:        General: Normal range of motion.     Cervical back: Normal range of motion.  Lymphadenopathy:     Cervical: No cervical adenopathy.  Neurological:     General: No focal deficit present.     Mental Status: She is alert and oriented to person, place, and time.     Cranial Nerves: No cranial nerve deficit.  Skin:    General: Skin is warm and dry.  Psychiatric:        Mood and Affect: Mood normal.        Behavior: Behavior normal.        Thought Content: Thought content normal.        Judgment: Judgment normal.  Vitals reviewed.     Assessment/Plan:  Encounter for annual routine gynecological examination  PCOS (polycystic ovarian syndrome)--having monthly menses.   Anxiety and depression--stopped SSRIs due to side effects. Pt to f/u prn.   Menorrhagia with regular cycle-Plan: CBC with Differential/Platelet, TSH, Iron, TIBC and Ferritin Panel  Hair loss disorder - Plan: CBC with  Differential/Platelet, TSH, Iron, TIBC and Ferritin Panel  Blood tests for routine general physical examination - Plan: Comprehensive metabolic panel, CBC with Differential/Platelet, Hemoglobin A1c, TSH, Iron, TIBC and Ferritin Panel  Pre-diabetes - Plan: Hemoglobin A1c  Thyroid  disorder screening - Plan: TSH            GYN counsel adequate intake of calcium and vitamin D, diet and exercise     F/U  No follow-ups on file.  Pier Laux B. Ashby Moskal, PA-C 12/07/2023 4:53 PM

## 2023-12-08 ENCOUNTER — Encounter: Payer: Self-pay | Admitting: Obstetrics and Gynecology

## 2023-12-08 ENCOUNTER — Ambulatory Visit: Admitting: Obstetrics and Gynecology

## 2023-12-08 VITALS — BP 115/80 | HR 71 | Ht 67.0 in | Wt 232.5 lb

## 2023-12-08 DIAGNOSIS — Z01419 Encounter for gynecological examination (general) (routine) without abnormal findings: Secondary | ICD-10-CM | POA: Diagnosis not present

## 2023-12-08 DIAGNOSIS — E282 Polycystic ovarian syndrome: Secondary | ICD-10-CM

## 2023-12-08 DIAGNOSIS — R7303 Prediabetes: Secondary | ICD-10-CM

## 2023-12-08 DIAGNOSIS — F419 Anxiety disorder, unspecified: Secondary | ICD-10-CM

## 2023-12-08 DIAGNOSIS — N920 Excessive and frequent menstruation with regular cycle: Secondary | ICD-10-CM

## 2023-12-08 DIAGNOSIS — N951 Menopausal and female climacteric states: Secondary | ICD-10-CM

## 2023-12-08 DIAGNOSIS — Z1231 Encounter for screening mammogram for malignant neoplasm of breast: Secondary | ICD-10-CM

## 2023-12-08 DIAGNOSIS — Z13220 Encounter for screening for lipoid disorders: Secondary | ICD-10-CM

## 2023-12-08 DIAGNOSIS — Z0000 Encounter for general adult medical examination without abnormal findings: Secondary | ICD-10-CM

## 2023-12-08 MED ORDER — PROGESTERONE 200 MG PO CAPS: 200.0000 mg | ORAL_CAPSULE | Freq: Every evening | ORAL | 3 refills | Status: AC

## 2023-12-08 NOTE — Patient Instructions (Addendum)
 I value your feedback and you entrusting [place] with your care. If you get a [facility] patient survey, I would appreciate you taking the time to let us know about your experience today. Thank you!  [facility] ([place])--[phone]

## 2023-12-16 ENCOUNTER — Other Ambulatory Visit

## 2023-12-16 DIAGNOSIS — R7303 Prediabetes: Secondary | ICD-10-CM | POA: Diagnosis not present

## 2023-12-16 DIAGNOSIS — Z13220 Encounter for screening for lipoid disorders: Secondary | ICD-10-CM

## 2023-12-16 DIAGNOSIS — N920 Excessive and frequent menstruation with regular cycle: Secondary | ICD-10-CM | POA: Diagnosis not present

## 2023-12-16 DIAGNOSIS — Z0000 Encounter for general adult medical examination without abnormal findings: Secondary | ICD-10-CM | POA: Diagnosis not present

## 2023-12-17 ENCOUNTER — Ambulatory Visit: Payer: Self-pay | Admitting: Obstetrics and Gynecology

## 2023-12-17 LAB — LIPID PANEL
Chol/HDL Ratio: 3.8 ratio (ref 0.0–4.4)
Cholesterol, Total: 176 mg/dL (ref 100–199)
HDL: 46 mg/dL (ref >39)
LDL Chol Calc (NIH): 115 mg/dL — ABNORMAL HIGH (ref 0–99)
Triglycerides: 83 mg/dL (ref 0–149)
VLDL Cholesterol Cal: 15 mg/dL (ref 5–40)

## 2023-12-17 LAB — CBC WITH DIFFERENTIAL/PLATELET
Basophils Absolute: 0.1 x10E3/uL (ref 0.0–0.2)
Basos: 1 %
EOS (ABSOLUTE): 0.2 x10E3/uL (ref 0.0–0.4)
Eos: 3 %
Hematocrit: 42.6 % (ref 34.0–46.6)
Hemoglobin: 13.3 g/dL (ref 11.1–15.9)
Immature Grans (Abs): 0 x10E3/uL (ref 0.0–0.1)
Immature Granulocytes: 0 %
Lymphocytes Absolute: 2.2 x10E3/uL (ref 0.7–3.1)
Lymphs: 29 %
MCH: 28.3 pg (ref 26.6–33.0)
MCHC: 31.2 g/dL — ABNORMAL LOW (ref 31.5–35.7)
MCV: 91 fL (ref 79–97)
Monocytes Absolute: 0.8 x10E3/uL (ref 0.1–0.9)
Monocytes: 10 %
Neutrophils Absolute: 4.4 x10E3/uL (ref 1.4–7.0)
Neutrophils: 57 %
Platelets: 324 x10E3/uL (ref 150–450)
RBC: 4.7 x10E6/uL (ref 3.77–5.28)
RDW: 12 % (ref 11.7–15.4)
WBC: 7.6 x10E3/uL (ref 3.4–10.8)

## 2023-12-17 LAB — COMPREHENSIVE METABOLIC PANEL WITH GFR
ALT: 17 IU/L (ref 0–32)
AST: 13 IU/L (ref 0–40)
Albumin: 4.3 g/dL (ref 3.9–4.9)
Alkaline Phosphatase: 75 IU/L (ref 41–116)
BUN/Creatinine Ratio: 19 (ref 9–23)
BUN: 12 mg/dL (ref 6–24)
Bilirubin Total: 0.3 mg/dL (ref 0.0–1.2)
CO2: 25 mmol/L (ref 20–29)
Calcium: 9.2 mg/dL (ref 8.7–10.2)
Chloride: 102 mmol/L (ref 96–106)
Creatinine, Ser: 0.64 mg/dL (ref 0.57–1.00)
Globulin, Total: 2.5 g/dL (ref 1.5–4.5)
Glucose: 95 mg/dL (ref 70–99)
Potassium: 4.7 mmol/L (ref 3.5–5.2)
Sodium: 139 mmol/L (ref 134–144)
Total Protein: 6.8 g/dL (ref 6.0–8.5)
eGFR: 114 mL/min/1.73 (ref >59)

## 2023-12-17 LAB — HEMOGLOBIN A1C
Est. average glucose Bld gHb Est-mCnc: 128 mg/dL
Hgb A1c MFr Bld: 6.1 % — ABNORMAL HIGH (ref 4.8–5.6)
